# Patient Record
Sex: Male | Born: 2000 | Hispanic: Yes | Marital: Single | Smoking: Never smoker
Health system: Southern US, Community
[De-identification: ages and names within clinical notes are randomized; demographics above are authoritative.]

## PROBLEM LIST (undated history)

## (undated) HISTORY — PX: OTHER SURGICAL HISTORY: SHX169

---
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
## 2021-03-28 ENCOUNTER — Ambulatory Visit (INDEPENDENT_AMBULATORY_CARE_PROVIDER_SITE_OTHER): Payer: BC Managed Care – PPO | Admitting: Family Medicine

## 2021-03-28 ENCOUNTER — Encounter: Payer: Self-pay | Admitting: Family Medicine

## 2021-03-28 ENCOUNTER — Other Ambulatory Visit: Payer: Self-pay

## 2021-03-28 VITALS — BP 123/57 | HR 73 | Temp 99.3°F | Ht 64.0 in | Wt 147.0 lb

## 2021-03-28 DIAGNOSIS — J029 Acute pharyngitis, unspecified: Secondary | ICD-10-CM | POA: Diagnosis not present

## 2021-03-28 DIAGNOSIS — R509 Fever, unspecified: Secondary | ICD-10-CM | POA: Diagnosis not present

## 2021-03-28 LAB — POCT RAPID STREP A (OFFICE): Rapid Strep A Screen: NEGATIVE

## 2021-03-28 MED ORDER — PREDNISONE 20 MG PO TABS: 40.0000 mg | ORAL_TABLET | Freq: Every day | ORAL | 0 refills | Status: DC

## 2021-03-28 NOTE — Progress Notes (Signed)
Acute Office Visit  Subjective:    Patient ID: Ray Fernandez, male    DOB: 03-Oct-2000, 20 y.o.   MRN: [id number]  Chief Complaint  Patient presents with   Sore Throat    HPI Patient is in today for sore throat that started 2 days ago he has had some sinus congestion with it and a headache mostly frontal.  He has had some chills.  Been mostly using throat lozenges.  Today it is incredibly painful to eat and talk and swallow.  He has never had strep throat before he was around his cousin last week for the holidays who just had a cold.  He said his brother actually had strep throat about a month ago.  No other GI symptoms.  No ear pain or pressure.  No past medical history on file.  No past surgical history on file.  Family History  Problem Relation Age of Onset   Thyroid disease Mother    Hypertension Father    Eczema Sister    Eczema Brother    Eczema Brother    Stroke Paternal Grandfather     Social History   Socioeconomic History   Marital status: Single    Spouse name: Not on file   Number of children: Not on file   Years of education: Not on file   Highest education level: Not on file  Occupational History   Occupation: Biochemist, clinical  Tobacco Use   Smoking status: Never   Smokeless tobacco: Never  Vaping Use   Vaping Use: Every day   Start date: 09/29/2019   Substances: Nicotine, CBD  Substance and Sexual Activity   Alcohol use: Yes    Alcohol/week: 1.0 - 2.0 standard drink    Types: 1 - 2 Standard drinks or equivalent per week   Drug use: Not Currently   Sexual activity: Not Currently  Other Topics Concern   Not on file  Social History Narrative   Not on file   Social Determinants of Health   Financial Resource Strain: Not on file  Food Insecurity: Not on file  Transportation Needs: Not on file  Physical Activity: Not on file  Stress: Not on file  Social Connections: Not on file  Intimate Partner Violence: Not on file    Outpatient  Medications Prior to Visit  Medication Sig Dispense Refill   acetaminophen (TYLENOL) 325 MG tablet Take 2 tablets (650 mg total) by mouth every 6 (six) hours as needed.     Cetirizine HCl (ZYRTEC PO) Take by mouth.     doxycycline (VIBRA-TABS) 100 MG tablet Take 1 tablet (100 mg total) by mouth 2 (two) times daily. 20 tablet 0   No facility-administered medications prior to visit.    Allergies  Allergen Reactions   Ibuprofen Swelling    Angioedema of the Lips    Review of Systems     Objective:    Physical Exam Constitutional:      Appearance: He is well-developed.  HENT:     Head: Normocephalic and atraumatic.     Right Ear: External ear normal.     Left Ear: External ear normal.     Nose: Nose normal. No rhinorrhea.     Mouth/Throat:     Mouth: Mucous membranes are moist.     Pharynx: Posterior oropharyngeal erythema present. No oropharyngeal exudate.  Eyes:     Conjunctiva/sclera: Conjunctivae normal.     Pupils: Pupils are equal, round, and reactive to light.  Neck:  Thyroid: No thyromegaly.  Cardiovascular:     Rate and Rhythm: Normal rate.     Heart sounds: Normal heart sounds.  Pulmonary:     Effort: Pulmonary effort is normal.     Breath sounds: Normal breath sounds.  Musculoskeletal:     Cervical back: Neck supple.  Lymphadenopathy:     Cervical: No cervical adenopathy.  Skin:    General: Skin is warm and dry.  Neurological:     Mental Status: He is alert and oriented to person, place, and time.    BP (!) 123/57   Pulse 73   Temp 99.3 F (37.4 C)   Ht _0  (1.626 m)   Wt 147 lb (66.7 kg)   SpO2 95%   BMI 25.23 kg/m  Wt Readings from Last 3 Encounters:  03/28/21 147 lb (66.7 kg) (39 %, Z= -0.29)*  11/09/20 138 lb 11.2 oz (62.9 kg) (27 %, Z= -0.62)*  09/28/20 146 lb 4.8 oz (66.4 kg) (40 %, Z= -0.24)*   * Growth percentiles are based on CDC (Boys, 2-20 Years) data.    Health Maintenance Due  Topic Date Due   COVID-19 Vaccine (1) Never  done   HPV VACCINES (1 - Risk male 3-dose series) Never done   HIV Screening  Never done   Hepatitis C Screening  Never done   TETANUS/TDAP  Never done   INFLUENZA VACCINE  11/28/2020       Topic Date Due   HPV VACCINES (1 - Risk male 3-dose series) Never done     No results found for: TSH No results found for: WBC, HGB, HCT, MCV, PLT No results found for: NA, K, CHLORIDE, CO2, GLUCOSE, BUN, CREATININE, BILITOT, ALKPHOS, AST, ALT, PROT, ALBUMIN, CALCIUM, ANIONGAP, EGFR, GFR No results found for: CHOL No results found for: HDL No results found for: LDLCALC No results found for: TRIG No results found for: CHOLHDL No results found for: HGBA1C     Assessment & Plan:   Problem List Items Addressed This Visit   None Visit Diagnoses     Sore throat    -  Primary   Relevant Orders   POCT rapid strep A (Completed)   Culture, Group A Strep   Novel Coronavirus, NAA ([facility])   Fever, unspecified fever cause       Relevant Orders   POCT rapid strep A (Completed)   Culture, Group A Strep   Novel Coronavirus, NAA ([facility])      Pharyngitis-rapid strep was negative it was sent for culture.  We will also test for COVID just to make sure since he is supposed to be in a wrestling tournament this weekend to just 1 to make sure we are having a lot of patients complain of very sore throat with COVID since he also has some headache nasal congestion and fever.  Did not test for flu.  Did send over some oral prednisone since his throat seems swollen he actually has a most a muffled voice.  The do not see any significant tonsillar swelling.  Urged him to pick the prednisone up as soon as he leaves.  Meds ordered this encounter  Medications   predniSONE (DELTASONE) 20 MG tablet    Sig: Take 2 tablets (40 mg total) by mouth daily with breakfast.    Dispense:  10 tablet    Refill:  0      Beatrice Lecher, MD

## 2021-03-29 LAB — NOVEL CORONAVIRUS, NAA: SARS-CoV-2, NAA: NOT DETECTED

## 2021-03-29 LAB — SARS-COV-2, NAA 2 DAY TAT

## 2021-03-30 ENCOUNTER — Encounter: Payer: Self-pay | Admitting: Family Medicine

## 2021-03-30 LAB — CULTURE, GROUP A STREP
MICRO NUMBER:: 12692310
SPECIMEN QUALITY:: ADEQUATE

## 2021-03-30 NOTE — Progress Notes (Signed)
Not aware of any interaction between those 2 medications.  If he still having some increased shortness of breath let me know and I can send over an antibiotic.

## 2021-03-30 NOTE — Progress Notes (Signed)
Call patient: Negative for strep throat on the culture.  We are happy to provide a medical note to see if he might be would get his money back for the contest on Sunday.

## 2021-03-30 NOTE — Progress Notes (Signed)
Call patient: Negative for COVID.  Please see if his throat is feeling a little bit better on the steroids.

## 2021-04-07 DIAGNOSIS — M25521 Pain in right elbow: Secondary | ICD-10-CM | POA: Diagnosis not present

## 2021-04-07 DIAGNOSIS — S53104A Unspecified dislocation of right ulnohumeral joint, initial encounter: Secondary | ICD-10-CM | POA: Diagnosis not present

## 2021-05-13 IMAGING — DX DG HAND COMPLETE 3+V*L*
3 series · 3 of 3 positions shown · non-contrast
Comparison: None.

CLINICAL DATA: Left thumb and wrist pain and swelling beginning
last night after wrestling.

EXAM:
LEFT HAND - COMPLETE 3+ VIEW

[hand pa]
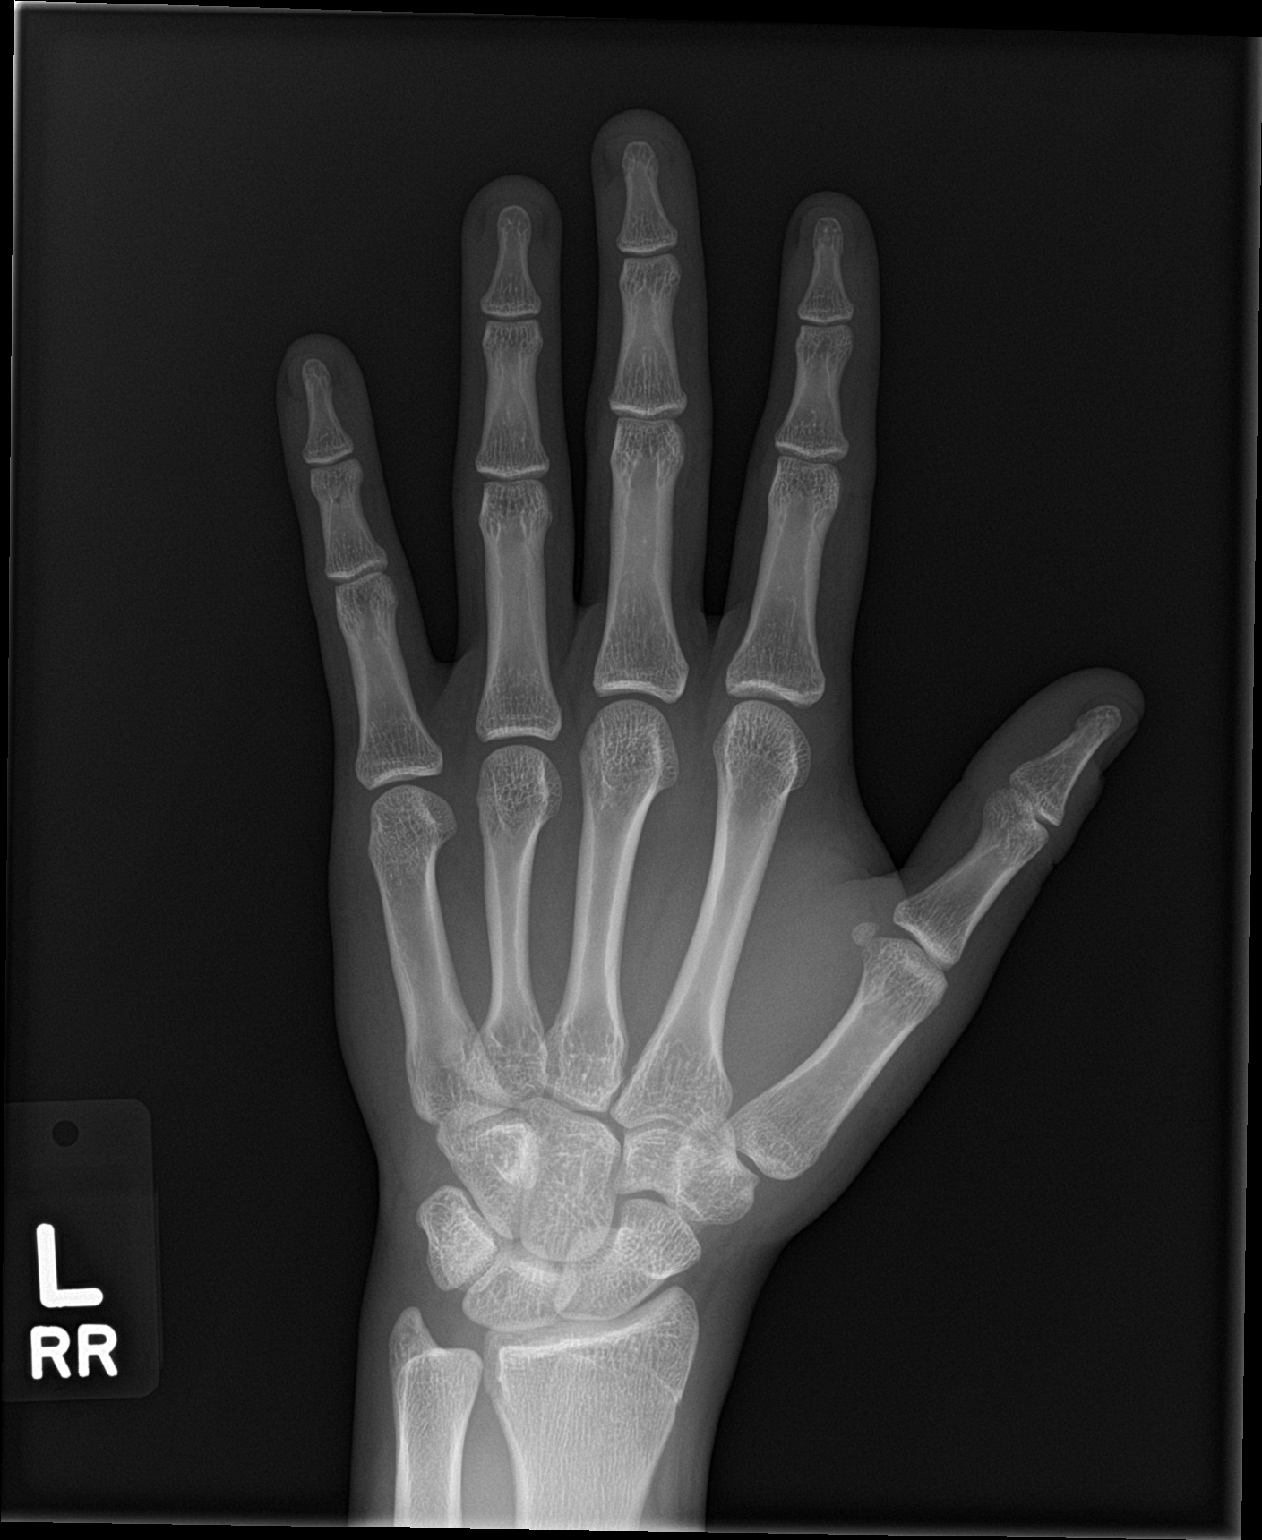

[hand obl]
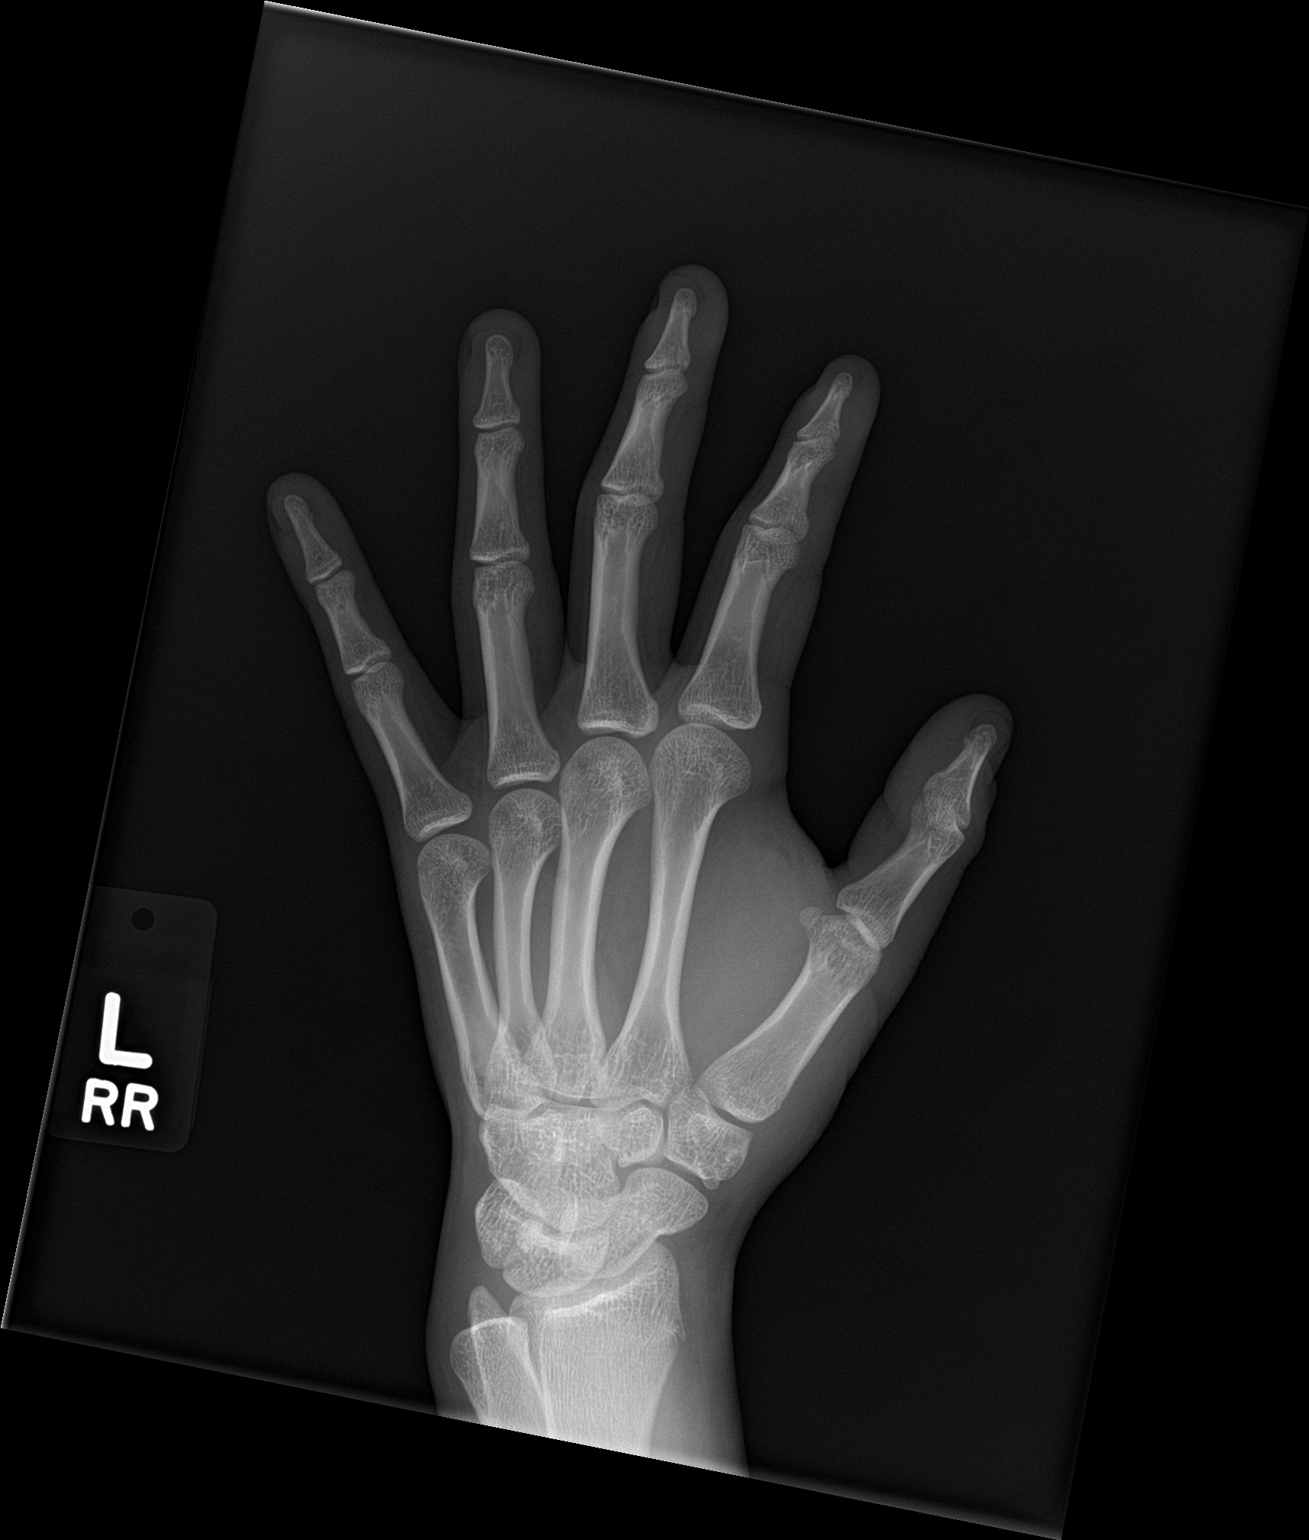

[hand lat]
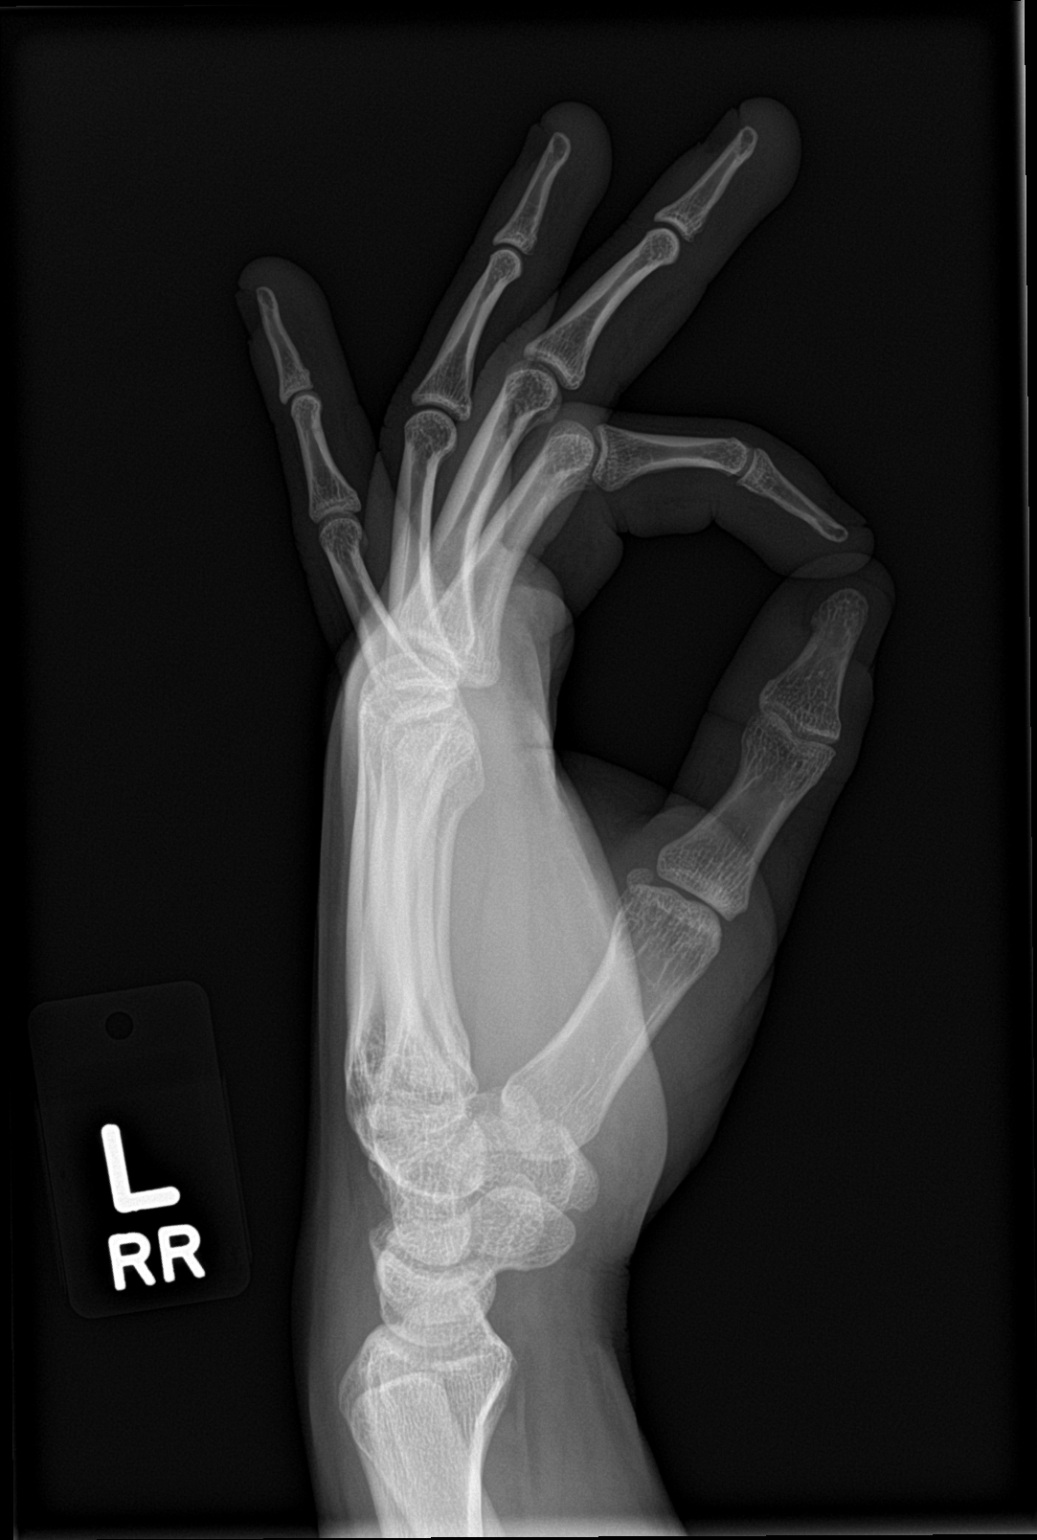

[3 of 3 positions shown; findings below may reference images not displayed]

FINDINGS: There is no evidence of fracture or dislocation. There is no
evidence of arthropathy or other focal bone abnormality. Soft
tissues are unremarkable.
IMPRESSION: Negative.

## 2021-05-13 IMAGING — DX DG WRIST COMPLETE 3+V*L*
4 series · 4 of 4 positions shown · non-contrast
Comparison: None.

CLINICAL DATA: Left thumb and wrist pain and swelling beginning
last night after wrestling.

EXAM:
LEFT WRIST - COMPLETE 3+ VIEW

[wrist pa]
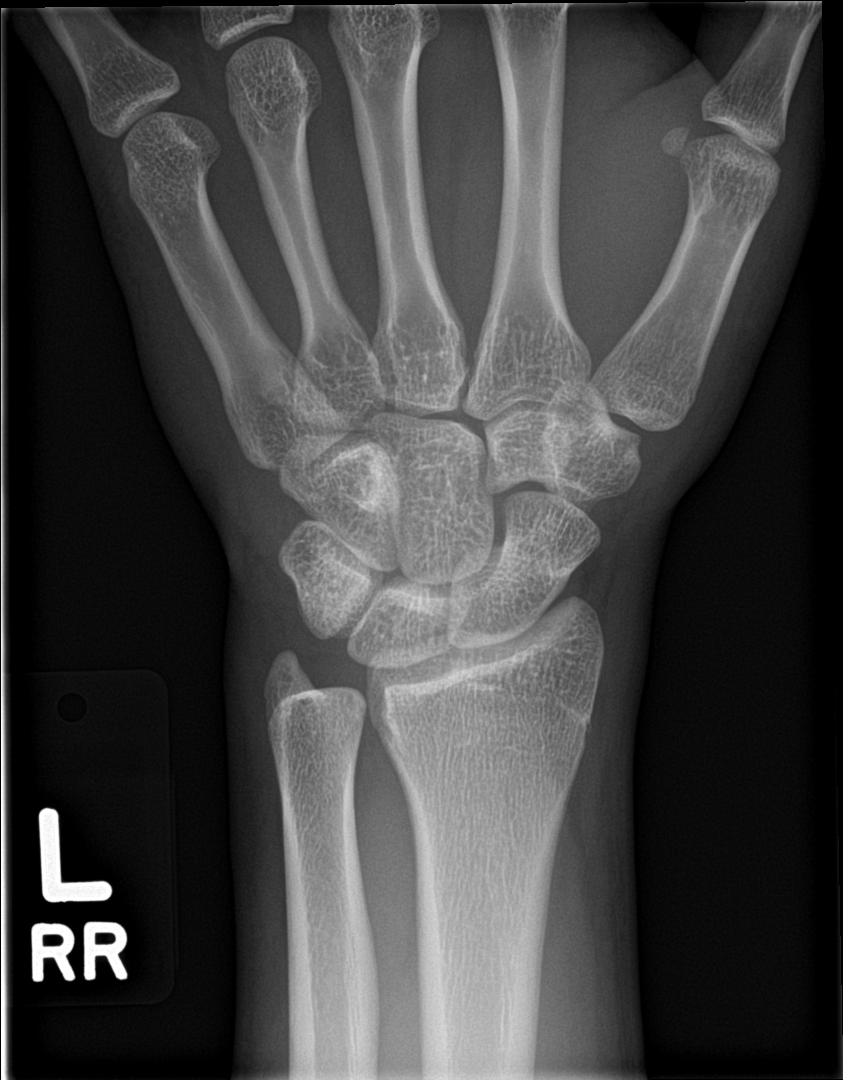

[wrist obl]
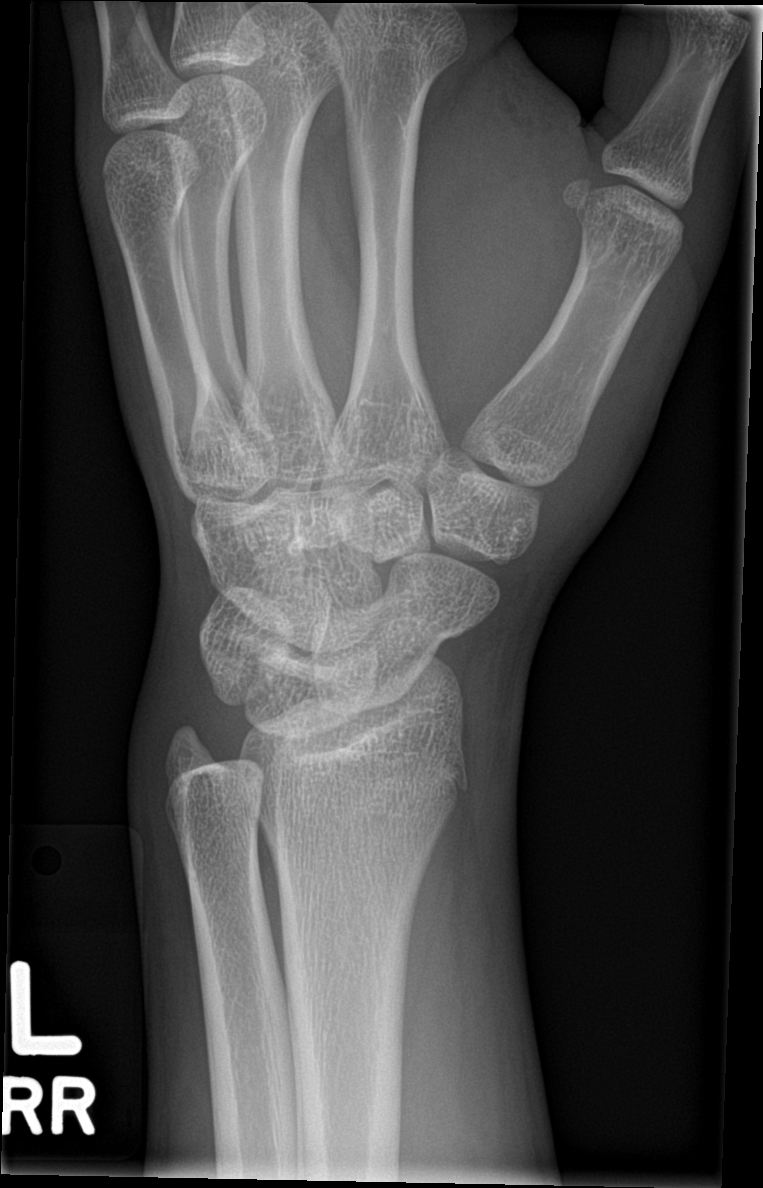

[wrist lat]
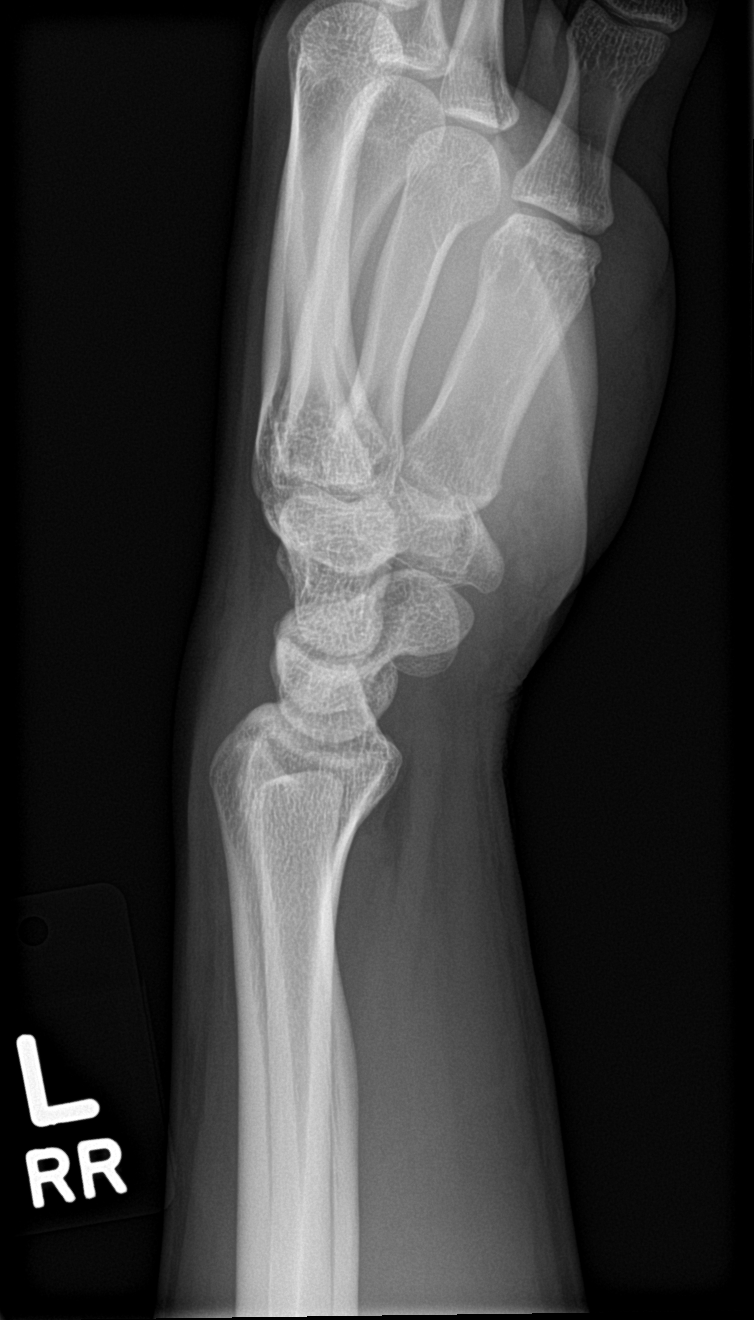

[wrist navicular]
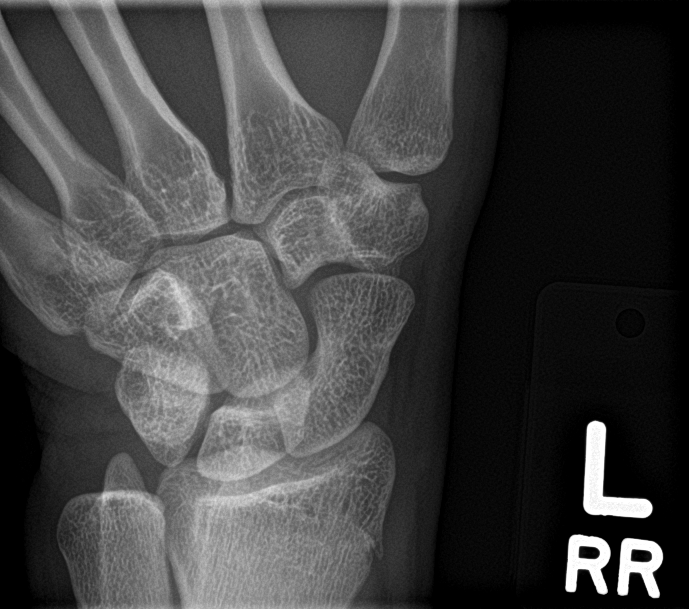

[4 of 4 positions shown; findings below may reference images not displayed]

FINDINGS: There is no evidence of fracture or dislocation. There is no
evidence of arthropathy or other focal bone abnormality. Soft
tissues are unremarkable.
IMPRESSION: Negative.

## 2021-05-14 DIAGNOSIS — S53431A Radial collateral ligament sprain of right elbow, initial encounter: Secondary | ICD-10-CM | POA: Diagnosis not present

## 2021-05-14 DIAGNOSIS — S53441A Ulnar collateral ligament sprain of right elbow, initial encounter: Secondary | ICD-10-CM | POA: Diagnosis not present

## 2021-05-14 DIAGNOSIS — M25421 Effusion, right elbow: Secondary | ICD-10-CM | POA: Diagnosis not present

## 2021-05-14 DIAGNOSIS — M25521 Pain in right elbow: Secondary | ICD-10-CM | POA: Diagnosis not present

## 2021-05-14 DIAGNOSIS — S53104A Unspecified dislocation of right ulnohumeral joint, initial encounter: Secondary | ICD-10-CM | POA: Diagnosis not present

## 2021-05-14 DIAGNOSIS — M7989 Other specified soft tissue disorders: Secondary | ICD-10-CM | POA: Diagnosis not present

## 2021-05-25 DIAGNOSIS — X58XXXD Exposure to other specified factors, subsequent encounter: Secondary | ICD-10-CM | POA: Diagnosis not present

## 2021-05-25 DIAGNOSIS — S53124D Posterior dislocation of right ulnohumeral joint, subsequent encounter: Secondary | ICD-10-CM | POA: Diagnosis not present

## 2021-05-25 DIAGNOSIS — Z789 Other specified health status: Secondary | ICD-10-CM | POA: Diagnosis not present

## 2021-05-25 DIAGNOSIS — R531 Weakness: Secondary | ICD-10-CM | POA: Diagnosis not present

## 2021-05-25 DIAGNOSIS — M25621 Stiffness of right elbow, not elsewhere classified: Secondary | ICD-10-CM | POA: Diagnosis not present

## 2021-05-25 DIAGNOSIS — M25521 Pain in right elbow: Secondary | ICD-10-CM | POA: Diagnosis not present

## 2021-05-25 DIAGNOSIS — M25529 Pain in unspecified elbow: Secondary | ICD-10-CM | POA: Diagnosis not present

## 2021-05-31 DIAGNOSIS — G5621 Lesion of ulnar nerve, right upper limb: Secondary | ICD-10-CM | POA: Diagnosis not present

## 2021-05-31 DIAGNOSIS — S53104D Unspecified dislocation of right ulnohumeral joint, subsequent encounter: Secondary | ICD-10-CM | POA: Diagnosis not present

## 2021-05-31 DIAGNOSIS — M25521 Pain in right elbow: Secondary | ICD-10-CM | POA: Diagnosis not present

## 2021-05-31 DIAGNOSIS — S46911A Strain of unspecified muscle, fascia and tendon at shoulder and upper arm level, right arm, initial encounter: Secondary | ICD-10-CM | POA: Diagnosis not present

## 2021-06-01 DIAGNOSIS — Z789 Other specified health status: Secondary | ICD-10-CM | POA: Diagnosis not present

## 2021-06-01 DIAGNOSIS — M25621 Stiffness of right elbow, not elsewhere classified: Secondary | ICD-10-CM | POA: Diagnosis not present

## 2021-06-01 DIAGNOSIS — S53124D Posterior dislocation of right ulnohumeral joint, subsequent encounter: Secondary | ICD-10-CM | POA: Diagnosis not present

## 2021-06-01 DIAGNOSIS — R531 Weakness: Secondary | ICD-10-CM | POA: Diagnosis not present

## 2021-06-01 DIAGNOSIS — M25529 Pain in unspecified elbow: Secondary | ICD-10-CM | POA: Diagnosis not present

## 2021-06-01 DIAGNOSIS — X58XXXD Exposure to other specified factors, subsequent encounter: Secondary | ICD-10-CM | POA: Diagnosis not present

## 2021-06-01 DIAGNOSIS — M25521 Pain in right elbow: Secondary | ICD-10-CM | POA: Diagnosis not present

## 2021-06-08 DIAGNOSIS — S53124D Posterior dislocation of right ulnohumeral joint, subsequent encounter: Secondary | ICD-10-CM | POA: Diagnosis not present

## 2021-06-08 DIAGNOSIS — M25621 Stiffness of right elbow, not elsewhere classified: Secondary | ICD-10-CM | POA: Diagnosis not present

## 2021-06-08 DIAGNOSIS — X58XXXD Exposure to other specified factors, subsequent encounter: Secondary | ICD-10-CM | POA: Diagnosis not present

## 2021-06-08 DIAGNOSIS — Z789 Other specified health status: Secondary | ICD-10-CM | POA: Diagnosis not present

## 2021-06-08 DIAGNOSIS — M25529 Pain in unspecified elbow: Secondary | ICD-10-CM | POA: Diagnosis not present

## 2021-06-08 DIAGNOSIS — M25521 Pain in right elbow: Secondary | ICD-10-CM | POA: Diagnosis not present

## 2021-06-08 DIAGNOSIS — R531 Weakness: Secondary | ICD-10-CM | POA: Diagnosis not present

## 2021-06-15 DIAGNOSIS — S53124D Posterior dislocation of right ulnohumeral joint, subsequent encounter: Secondary | ICD-10-CM | POA: Diagnosis not present

## 2021-06-15 DIAGNOSIS — R531 Weakness: Secondary | ICD-10-CM | POA: Diagnosis not present

## 2021-06-15 DIAGNOSIS — M25529 Pain in unspecified elbow: Secondary | ICD-10-CM | POA: Diagnosis not present

## 2021-06-15 DIAGNOSIS — Z789 Other specified health status: Secondary | ICD-10-CM | POA: Diagnosis not present

## 2021-06-15 DIAGNOSIS — M25621 Stiffness of right elbow, not elsewhere classified: Secondary | ICD-10-CM | POA: Diagnosis not present

## 2021-06-15 DIAGNOSIS — X58XXXD Exposure to other specified factors, subsequent encounter: Secondary | ICD-10-CM | POA: Diagnosis not present

## 2021-06-15 DIAGNOSIS — M25521 Pain in right elbow: Secondary | ICD-10-CM | POA: Diagnosis not present

## 2021-06-22 DIAGNOSIS — M25521 Pain in right elbow: Secondary | ICD-10-CM | POA: Diagnosis not present

## 2021-06-22 DIAGNOSIS — R531 Weakness: Secondary | ICD-10-CM | POA: Diagnosis not present

## 2021-06-22 DIAGNOSIS — M25621 Stiffness of right elbow, not elsewhere classified: Secondary | ICD-10-CM | POA: Diagnosis not present

## 2021-06-22 DIAGNOSIS — M25529 Pain in unspecified elbow: Secondary | ICD-10-CM | POA: Diagnosis not present

## 2021-06-22 DIAGNOSIS — X58XXXD Exposure to other specified factors, subsequent encounter: Secondary | ICD-10-CM | POA: Diagnosis not present

## 2021-06-22 DIAGNOSIS — S53124D Posterior dislocation of right ulnohumeral joint, subsequent encounter: Secondary | ICD-10-CM | POA: Diagnosis not present

## 2021-06-22 DIAGNOSIS — Z789 Other specified health status: Secondary | ICD-10-CM | POA: Diagnosis not present

## 2021-06-29 DIAGNOSIS — S53124D Posterior dislocation of right ulnohumeral joint, subsequent encounter: Secondary | ICD-10-CM | POA: Diagnosis not present

## 2021-06-29 DIAGNOSIS — X58XXXD Exposure to other specified factors, subsequent encounter: Secondary | ICD-10-CM | POA: Diagnosis not present

## 2021-06-29 DIAGNOSIS — R531 Weakness: Secondary | ICD-10-CM | POA: Diagnosis not present

## 2021-06-29 DIAGNOSIS — M25621 Stiffness of right elbow, not elsewhere classified: Secondary | ICD-10-CM | POA: Diagnosis not present

## 2021-06-29 DIAGNOSIS — M25529 Pain in unspecified elbow: Secondary | ICD-10-CM | POA: Diagnosis not present

## 2021-06-29 DIAGNOSIS — M25521 Pain in right elbow: Secondary | ICD-10-CM | POA: Diagnosis not present

## 2021-07-12 DIAGNOSIS — S53104D Unspecified dislocation of right ulnohumeral joint, subsequent encounter: Secondary | ICD-10-CM | POA: Diagnosis not present

## 2021-07-12 DIAGNOSIS — M25521 Pain in right elbow: Secondary | ICD-10-CM | POA: Diagnosis not present

## 2021-08-15 DIAGNOSIS — M222X2 Patellofemoral disorders, left knee: Secondary | ICD-10-CM | POA: Diagnosis not present

## 2021-08-15 DIAGNOSIS — G5621 Lesion of ulnar nerve, right upper limb: Secondary | ICD-10-CM | POA: Diagnosis not present

## 2021-08-15 DIAGNOSIS — M222X1 Patellofemoral disorders, right knee: Secondary | ICD-10-CM | POA: Diagnosis not present

## 2021-08-15 DIAGNOSIS — M9903 Segmental and somatic dysfunction of lumbar region: Secondary | ICD-10-CM | POA: Diagnosis not present

## 2021-08-24 DIAGNOSIS — M222X1 Patellofemoral disorders, right knee: Secondary | ICD-10-CM | POA: Diagnosis not present

## 2021-08-24 DIAGNOSIS — G5621 Lesion of ulnar nerve, right upper limb: Secondary | ICD-10-CM | POA: Diagnosis not present

## 2021-08-24 DIAGNOSIS — M9903 Segmental and somatic dysfunction of lumbar region: Secondary | ICD-10-CM | POA: Diagnosis not present

## 2021-08-24 DIAGNOSIS — M222X2 Patellofemoral disorders, left knee: Secondary | ICD-10-CM | POA: Diagnosis not present

## 2021-08-29 DIAGNOSIS — M9905 Segmental and somatic dysfunction of pelvic region: Secondary | ICD-10-CM | POA: Diagnosis not present

## 2021-08-29 DIAGNOSIS — G5621 Lesion of ulnar nerve, right upper limb: Secondary | ICD-10-CM | POA: Diagnosis not present

## 2021-08-29 DIAGNOSIS — M222X1 Patellofemoral disorders, right knee: Secondary | ICD-10-CM | POA: Diagnosis not present

## 2021-08-29 DIAGNOSIS — M7918 Myalgia, other site: Secondary | ICD-10-CM | POA: Diagnosis not present

## 2021-08-29 DIAGNOSIS — M545 Low back pain, unspecified: Secondary | ICD-10-CM | POA: Diagnosis not present

## 2021-08-29 DIAGNOSIS — M9903 Segmental and somatic dysfunction of lumbar region: Secondary | ICD-10-CM | POA: Diagnosis not present

## 2021-08-29 DIAGNOSIS — M222X2 Patellofemoral disorders, left knee: Secondary | ICD-10-CM | POA: Diagnosis not present

## 2021-08-29 DIAGNOSIS — M9904 Segmental and somatic dysfunction of sacral region: Secondary | ICD-10-CM | POA: Diagnosis not present

## 2021-08-29 DIAGNOSIS — M25561 Pain in right knee: Secondary | ICD-10-CM | POA: Diagnosis not present

## 2021-08-30 ENCOUNTER — Ambulatory Visit (INDEPENDENT_AMBULATORY_CARE_PROVIDER_SITE_OTHER): Payer: BC Managed Care – PPO | Admitting: Physician Assistant

## 2021-08-30 ENCOUNTER — Encounter: Payer: Self-pay | Admitting: Physician Assistant

## 2021-08-30 VITALS — BP 113/64 | HR 54 | Temp 98.4°F | Ht 64.0 in | Wt 147.0 lb

## 2021-08-30 DIAGNOSIS — N451 Epididymitis: Secondary | ICD-10-CM | POA: Diagnosis not present

## 2021-08-30 MED ORDER — DOXYCYCLINE HYCLATE 100 MG PO TABS: 100.0000 mg | ORAL_TABLET | Freq: Two times a day (BID) | ORAL | 0 refills | Status: DC

## 2021-08-30 MED ORDER — CEFTRIAXONE SODIUM 500 MG IJ SOLR
500.0000 mg | Freq: Once | INTRAMUSCULAR | Status: AC
  Administered 2021-08-30: 500 mg via INTRAMUSCULAR

## 2021-08-30 NOTE — Progress Notes (Signed)
? ?  Acute Office Visit ? ?Subjective:  ? ?  ?Patient ID: Ray Fernandez, male    DOB: 2001/02/25, 21 y.o.   MRN: [id number] ? ?Chief Complaint  ?Patient presents with  ? Testicle Pain  ?  History of epididymis - was given antibiotics, best friend died and he didn't finish them, still having problems  ? ? ?HPI ?Patient is in today to discuss left testicular pain. He has hx of ependymitis that was confirmed by ultrasound on 07/08/2020 and sent levaquin and then followed up with Dr. Ashley Royalty on 09/28/2020 and given doxycycline that he did not finish. It would get mostly better and then he would stop treatment. Mostly effects left but can start to effect right testicle. Denies any painful intercourse, blood in semen/urine, dysuria or penile discharge.  He tested negative for STIs in the past. He would like to be treated again.  ? ?ROS ?See HPI.  ?   ?Objective:  ?  ?BP 113/64   Pulse (!) 54   Temp 98.4 ?F (36.9 ?C) (Oral)   Ht 5\' 4"  (1.626 m)   Wt 147 lb (66.7 kg)   SpO2 100%   BMI 25.23 kg/m?  ? ? ? ? ?   ?Assessment & Plan:  ?..Dauntae was seen today for testicle pain. ? ?Diagnoses and all orders for this visit: ? ?Epididymitis ?-     doxycycline (VIBRA-TABS) 100 MG tablet; Take 1 tablet (100 mg total) by mouth 2 (two) times daily. ?-     cefTRIAXone (ROCEPHIN) injection 500 mg ? ? ?If not improving in next 7 days need to follow up with PCP to discuss more intervention ?Treated with rocephin and doxycycline for 10 days ?Ibuprofen as needed for pain and swelling ? ? ?Enid Derry, PA-C ? ? ?

## 2021-08-30 NOTE — Patient Instructions (Addendum)
Doxycycline for 10 days.  ?Epididymitis ? ?Epididymitis is inflammation or swelling of the epididymis. This is caused by an infection. The epididymis is a cord-like structure that is located along the top and back part of the testicle. It collects and stores sperm from the testicle. ?This condition can also cause pain and swelling of the testicle and scrotum. Symptoms usually start suddenly (acute epididymitis). Sometimes epididymitis starts gradually and lasts for a while (chronic epididymitis). Chronic epididymitis may be harder to treat. ?What are the causes? ?In men ages 27-40, this condition is usually caused by a bacterial infection or a sexually transmitted infection (STI), such as gonorrhea or chlamydia. ?In men 57 and older, this condition is usually caused by bacteria from a urinary blockage or from abnormalities in the urinary system. These can result from: ?Having a tube placed into the bladder (urinary catheter). ?Having an enlarged or inflamed prostate gland. ?Having recently had urinary tract surgery. ?Having a problem with a backward flow of urine (retrograde). ?In men who have a condition that weakens the body's defense system (immune system), such as human immunodeficiency virus (HIV), this condition can be caused by: ?Other bacteria, including tuberculosis and syphilis. ?Viruses. ?Fungi. ?Sometimes this condition occurs without infection. This may happen because of trauma or repetitive activities such as sports. ?What increases the risk? ?You are more likely to develop this condition if you have: ?Unprotected sex with more than one partner. ?Anal sex. ?Had recent surgery. ?A urinary catheter. ?Urinary problems. ?A suppressed immune system. ?What are the signs or symptoms? ?This condition usually begins suddenly with chills, fever, and pain behind the scrotum and in the testicle. Other symptoms include: ?Swelling of the scrotum, testicle, or both. ?Pain when ejaculating or urinating. ?Pain in the  back or abdomen. ?Nausea. ?Itching and discharge from the penis. ?A frequent need to pass urine. ?Redness, increased warmth, and tenderness of the scrotum. ?How is this diagnosed? ?Your health care provider can diagnose this condition based on your symptoms and medical history. Your health care provider will also do a physical exam to check your scrotum and testicle for swelling, pain, and redness. You may also have other tests, including: ?Testing of discharge from the penis. ?Testing your urine for infections, such as STIs. ?Ultrasound to check for blood flow and inflammation. ?Your health care provider may test you for other STIs, including HIV. ?How is this treated? ?Treatment for this condition depends on the cause. If your condition is caused by a bacterial infection, oral antibiotic medicine may be prescribed. If the bacterial infection has spread to your blood, you may need to receive IV antibiotics. ?For both bacterial and nonbacterial epididymitis, you may be treated with: ?Rest. ?Elevation of the scrotum. ?Pain medicines. ?Anti-inflammatory medicines. ?Surgery may be needed if: ?You have pus buildup in the scrotum (abscess). ?You have epididymitis that has not responded to other treatments. ?Follow these instructions at home: ?Medicines ?Take over-the-counter and prescription medicines only as told by your health care provider. ?If you were prescribed an antibiotic medicine, take it as told by your health care provider. Do not stop taking the antibiotic even if your condition improves. ?Sexual activity ?If your epididymitis was caused by an STI, avoid sexual activity until your treatment is complete. ?Inform your sexual partner or partners if you test positive for an STI. They may need to be treated. Do not engage in sexual activity with your partner or partners until their treatment is completed. ?Managing pain and swelling ? ?If directed, raise (elevate)  your scrotum and apply ice. To do this: ?Put ice  in a plastic bag. ?Place a small towel or pillow between your legs. ?Rest your scrotum on the pillow or towel. ?Place another towel between your skin and the plastic bag. ?Leave the ice on for 20 minutes, 2-3 times a day. ?Remove the ice if your skin turns bright red. This is very important. If you cannot feel pain, heat, or cold, you have a greater risk of damage to the area. ?Keep your scrotum elevated and supported while resting. Ask your health care provider if you should wear a scrotal support, such as a jockstrap. Wear it as told by your health care provider. ?Try taking a sitz bath to help with discomfort. This is a warm water bath that is taken while you are sitting down. The water should come up to your hips and should cover your buttocks. Do this 3-4 times per day or as told by your health care provider. ?General instructions ?Drink enough fluid to keep your urine pale yellow. ?Return to your normal activities as told by your health care provider. Ask your health care provider what activities are safe for you. ?Keep all follow-up visits. This is important. ?Contact a health care provider if: ?You have a fever. ?Your pain medicine is not helping. ?Your pain is getting worse. ?Your symptoms do not improve within 3 days. ?Summary ?Epididymitis is inflammation or swelling of the epididymis. This is caused by an infection. This condition can also cause pain and swelling of the testicle and scrotum. ?Treatment for this condition depends on the cause. If your condition is caused by a bacterial infection, oral antibiotic medicine may be prescribed. ?Inform your sexual partner or partners if you test positive for an STI. They may need to be treated. Do not engage in sexual activity with your partner or partners until their treatment is completed. ?Contact a health care provider if your symptoms do not improve within 3 days. ?This information is not intended to replace advice given to you by your health care  provider. Make sure you discuss any questions you have with your health care provider. ?Document Revised: 11/23/2020 Document Reviewed: 11/23/2020 ?Elsevier Patient Education ? 2023 [employer] ? ?

## 2021-09-07 ENCOUNTER — Encounter: Payer: Self-pay | Admitting: Family Medicine

## 2021-09-07 ENCOUNTER — Ambulatory Visit (INDEPENDENT_AMBULATORY_CARE_PROVIDER_SITE_OTHER): Payer: BC Managed Care – PPO | Admitting: Family Medicine

## 2021-09-07 ENCOUNTER — Telehealth: Payer: Self-pay | Admitting: Family Medicine

## 2021-09-07 VITALS — BP 112/58 | HR 62 | Ht 64.0 in | Wt 148.0 lb

## 2021-09-07 DIAGNOSIS — R9431 Abnormal electrocardiogram [ECG] [EKG]: Secondary | ICD-10-CM | POA: Diagnosis not present

## 2021-09-07 DIAGNOSIS — R55 Syncope and collapse: Secondary | ICD-10-CM | POA: Diagnosis not present

## 2021-09-07 NOTE — Telephone Encounter (Signed)
Added Aunt with Epilepsy to family history per family request.  ?

## 2021-09-07 NOTE — Progress Notes (Signed)
? ?Acute Office Visit ? ?Subjective:  ? ?  ?Patient ID: Ray Fernandez, male    DOB: 04-08-2001, 21 y.o.   MRN: [id number] ? ?Chief Complaint  ?Patient presents with  ? Weakness  ? Loss of Consciousness  ? ? ?HPI ?Patient is in today for syncope. ? ?He was actually seen in our office about 7 days ago for left testicular pain and was treated for epididymitis.  He had had previous issues with this the prior year in 2022.  Time he was treated with oral doxycycline and Rocephin 500 mg injection.  He says since being on the antibiotics he has had loose stools.  And then yesterday he said he worked out extra hard doing some cardio.  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  He said he really did not have much to eat or drink yesterday he hydrated a little but probably not enough by the time dinnertime came around he was sitting there and said he felt like his ears were starting to rollover and his vision was starting to narrow.  He said he felt like he just wanted to sleep.  He said that sensation lasted maybe 5 minutes and then he actually passed out maybe 4-minute.  He came back to and then passed out maybe 10 or 15 minutes later again briefly for maybe a minute.  EMS was called and they did check him out he did not go to the emergency department.  He does not remember experiencing any chest pain or shortness of breath or palpitations just the feeling in his head like he was going to pass out.  He had had edible marijuana yesterday as well. ? ?He has passed out once before and that was on a camping trip when he had had mushrooms. ? ?Today he does feel better he says he still feels a little lightheaded but he also feels hungry but he has not eaten today yet. ? ?ROS ? ? ?   ?Objective:  ?  ?BP (!) 112/58   Pulse 62   Ht 5\' 4"  (1.626 m)   Wt 148 lb (67.1 kg)   SpO2 100%   BMI 25.40 kg/m?   ? ? ?Physical Exam ?Constitutional:   ?   Appearance: He is well-developed.  ?HENT:  ?   Head: Normocephalic and atraumatic.  ?   Right Ear: External ear normal.  ?   Left Ear: External ear normal.  ?   Nose: Nose normal.  ?Eyes:  ?   Conjunctiva/sclera: Conjunctivae normal.  ?   Pupils: Pupils are equal, round, and reactive to light.  ?Neck:  ?   Thyroid: No thyromegaly.  ?Cardiovascular:  ?   Rate and Rhythm: Normal rate.  ?   Heart sounds: Normal heart sounds.  ?Pulmonary:  ?   Effort: Pulmonary effort is normal.  ?   Breath sounds: Normal breath sounds.  ?Musculoskeletal:  ?   Cervical back: Neck supple.  ?Lymphadenopathy:  ?   Cervical: No cervical adenopathy.  ?Skin: ?   General: Skin is warm and dry.  ?Neurological:  ?   Mental Status: He is alert and oriented to person, place, and time.  ? ? ?No results found for any visits on 09/07/21. ? ? ?   ?Assessment & Plan:  ? ?Problem List Items Addressed This Visit   ?None ?Visit  Diagnoses   ? ? Syncope, unspecified syncope type    -  Primary  ? Relevant Orders  ? CBC with Differential/Platelet  ? TSH  ? COMPLETE METABOLIC PANEL WITH GFR  ? EKG 12-Lead  ? Ambulatory referral to Cardiology  ? Abnormal EKG      ? Relevant Orders  ? Ambulatory referral to Cardiology  ? ?  ? ? ?Syncope yesterday evening-think probably secondary to loose stools from his antibiotics, dehydration and from increased excess exercise and having edible marijuana married.  Was probably the perfect storm.  Though we will do some labs today just to rule out electrolyte abnormality, thyroid disorder, anemia etc.  KG today shows rate of 53 bpm, sinus bradycardia with incomplete right bundle branch block.  No prior EKGs on file for comparison.  No way to really know if this is his baseline or not.  Will refer to cardiology nonurgently for further evaluation for the abnormal EKG.  But again I think the syncope was probably from a combination of factors but mostly dehydration.  Monitor for any new or  worsening symptoms.  In the meantime make sure hydrating well especially with exercise and eating regularly.  He will actually finishes antibiotics in about 2 days so that should hopefully help with the loose stools.  Encouraged him to consider taking probiotic or eating yogurt. ? ?No orders of the defined types were placed in this encounter. ? ? ?No follow-ups on file. ? ?Beatrice Lecher, MD ? ? ?

## 2021-09-07 NOTE — Telephone Encounter (Signed)
Patient's mother was in office and stated that she wanted to make sure with the family history with patient, to make sure Epilepsy is added to the record as well. It is patient's mother's half sister? Please Advise. AMUCK ?

## 2021-09-08 ENCOUNTER — Other Ambulatory Visit: Payer: Self-pay | Admitting: *Deleted

## 2021-09-08 ENCOUNTER — Telehealth: Payer: Self-pay

## 2021-09-08 DIAGNOSIS — R7401 Elevation of levels of liver transaminase levels: Secondary | ICD-10-CM

## 2021-09-08 LAB — CBC WITH DIFFERENTIAL/PLATELET
Absolute Monocytes: 502 cells/uL (ref 200–950)
Basophils Absolute: 41 cells/uL (ref 0–200)
Basophils Relative: 0.7 %
Eosinophils Absolute: 83 cells/uL (ref 15–500)
Eosinophils Relative: 1.4 %
HCT: 43.6 % (ref 38.5–50.0)
Hemoglobin: 14.6 g/dL (ref 13.2–17.1)
Lymphs Abs: 1888 cells/uL (ref 850–3900)
MCH: 29.4 pg (ref 27.0–33.0)
MCHC: 33.5 g/dL (ref 32.0–36.0)
MCV: 87.7 fL (ref 80.0–100.0)
MPV: 11.8 fL (ref 7.5–12.5)
Monocytes Relative: 8.5 %
Neutro Abs: 3387 cells/uL (ref 1500–7800)
Neutrophils Relative %: 57.4 %
Platelets: 244 10*3/uL (ref 140–400)
RBC: 4.97 10*6/uL (ref 4.20–5.80)
RDW: 12.6 % (ref 11.0–15.0)
Total Lymphocyte: 32 %
WBC: 5.9 10*3/uL (ref 3.8–10.8)

## 2021-09-08 LAB — COMPLETE METABOLIC PANEL WITH GFR
AG Ratio: 1.9 (calc) (ref 1.0–2.5)
ALT: 20 U/L (ref 8–46)
AST: 40 U/L — ABNORMAL HIGH (ref 12–32)
Albumin: 4.4 g/dL (ref 3.6–5.1)
Alkaline phosphatase (APISO): 76 U/L (ref 46–169)
BUN: 15 mg/dL (ref 7–20)
CO2: 27 mmol/L (ref 20–32)
Calcium: 9.7 mg/dL (ref 8.9–10.4)
Chloride: 104 mmol/L (ref 98–110)
Creat: 0.88 mg/dL (ref 0.60–1.24)
Globulin: 2.3 g/dL (calc) (ref 2.1–3.5)
Glucose, Bld: 95 mg/dL (ref 65–99)
Potassium: 4.4 mmol/L (ref 3.8–5.1)
Sodium: 139 mmol/L (ref 135–146)
Total Bilirubin: 1 mg/dL (ref 0.2–1.1)
Total Protein: 6.7 g/dL (ref 6.3–8.2)
eGFR: 127 mL/min/{1.73_m2} (ref >=60)

## 2021-09-08 LAB — TSH: TSH: 0.58 mIU/L (ref 0.50–4.30)

## 2021-09-08 NOTE — Telephone Encounter (Signed)
Mom is requested a referral to Neurology due to family history of Maternal Grandmother and Maternal Aunt having epilepsy. ?

## 2021-09-08 NOTE — Progress Notes (Signed)
Hi Evangelos, one of your liver enzymes is a little elevated the AST.  I would like to recheck that in about 2 weeks to make sure that it is going back down to normal.  Please avoid any alcohol or Tylenol products during that time.  Your blood count is normal no sign of anemia.  Thyroid looks great.

## 2021-09-09 NOTE — Telephone Encounter (Signed)
Since he did have an abnormal EKG I am referring him to Cards. Lets check that out first and then we can circle back to Neurology ?

## 2021-09-12 DIAGNOSIS — G5621 Lesion of ulnar nerve, right upper limb: Secondary | ICD-10-CM | POA: Diagnosis not present

## 2021-09-12 DIAGNOSIS — M545 Low back pain, unspecified: Secondary | ICD-10-CM | POA: Diagnosis not present

## 2021-09-12 DIAGNOSIS — M9904 Segmental and somatic dysfunction of sacral region: Secondary | ICD-10-CM | POA: Diagnosis not present

## 2021-09-12 DIAGNOSIS — M9905 Segmental and somatic dysfunction of pelvic region: Secondary | ICD-10-CM | POA: Diagnosis not present

## 2021-09-12 DIAGNOSIS — M9903 Segmental and somatic dysfunction of lumbar region: Secondary | ICD-10-CM | POA: Diagnosis not present

## 2021-09-12 DIAGNOSIS — M7918 Myalgia, other site: Secondary | ICD-10-CM | POA: Diagnosis not present

## 2021-09-12 DIAGNOSIS — M222X1 Patellofemoral disorders, right knee: Secondary | ICD-10-CM | POA: Diagnosis not present

## 2021-09-12 DIAGNOSIS — M222X2 Patellofemoral disorders, left knee: Secondary | ICD-10-CM | POA: Diagnosis not present

## 2021-09-12 NOTE — Telephone Encounter (Signed)
Called pt and advised him of Dr. Shelah Lewandowsky recommendations. Advised that should he wish to move fwd with the neurology referral he should schedule an appointment w/pcp to discuss and get this done. He voiced understanding and agreed.   ?

## 2021-09-21 NOTE — Progress Notes (Signed)
Referring-Catherine Metheney MD Reason for referral-syncope  HPI: 21 year old male for evaluation of syncope at request of Beatrice Lecher MD.  Laboratories May 2023 showed hemoglobin 14.6 and potassium 4.4.  Patient typically does not have dyspnea on exertion, orthopnea, PND, pedal edema, palpitations.  He states approximately 2 weeks ago he was having dinner.  He had a "edible" 30 minutes prior to eating.  He developed a queasy sensation at dinner followed by frank syncope.  He was unconscious for approximately 15 seconds and his family laid him on the ground.  There is no preceding palpitations, dyspnea or chest pain.  He denies incontinence.  Question of transient seizure activity.  When he awoke they sat him up.  He stood to leave 10 minutes later and had a second syncopal episode.  He was unconscious for 30 seconds with similar description.  Cardiology is now asked to evaluate.  He is only previous syncopal episode was at a camp after "mushrooms".  No family history of sudden death.  Current Outpatient Medications  Medication Sig Dispense Refill   doxycycline (VIBRA-TABS) 100 MG tablet Take 1 tablet (100 mg total) by mouth 2 (two) times daily. (Patient not taking: Reported on 09/27/2021) 20 tablet 0   No current facility-administered medications for this visit.    Allergies  Allergen Reactions   Ibuprofen Swelling    Angioedema of the Lips    History reviewed. No pertinent past medical history.  Past Surgical History:  Procedure Laterality Date   No prior surgery      Social History   Socioeconomic History   Marital status: Single    Spouse name: Not on file   Number of children: Not on file   Years of education: Not on file   Highest education level: Not on file  Occupational History   Occupation: Biochemist, clinical  Tobacco Use   Smoking status: Never   Smokeless tobacco: Never  Vaping Use   Vaping Use: Every day   Start date: 09/29/2019   Substances: Nicotine,  CBD  Substance and Sexual Activity   Alcohol use: Yes    Alcohol/week: 1.0 - 2.0 standard drink    Types: 1 - 2 Standard drinks or equivalent per week    Comment: Occasional   Drug use: Yes    Types: Marijuana   Sexual activity: Not Currently  Other Topics Concern   Not on file  Social History Narrative   Not on file   Social Determinants of Health   Financial Resource Strain: Not on file  Food Insecurity: Not on file  Transportation Needs: Not on file  Physical Activity: Not on file  Stress: Not on file  Social Connections: Not on file  Intimate Partner Violence: Not on file    Family History  Problem Relation Age of Onset   Thyroid disease Mother    Hypertension Father    Eczema Sister    Eczema Brother    Eczema Brother    Epilepsy Maternal Aunt    Epilepsy Maternal Grandmother    Stroke Paternal Grandfather     ROS: no fevers or chills, productive cough, hemoptysis, dysphasia, odynophagia, melena, hematochezia, dysuria, hematuria, rash, seizure activity, orthopnea, PND, pedal edema, claudication. Remaining systems are negative.  Physical Exam:   Blood pressure 112/76, pulse 75, height 5\' 4"  (1.626 m), weight 146 lb (66.2 kg), SpO2 98 %.  General:  Well developed/well nourished in NAD Skin warm/dry Patient not depressed No peripheral clubbing Back-normal HEENT-normal/normal eyelids Neck supple/normal carotid  upstroke bilaterally; no bruits; no JVD; no thyromegaly chest - CTA/ normal expansion CV - RRR/normal S1 and S2; no murmurs, rubs or gallops;  PMI nondisplaced Abdomen -NT/ND, no HSM, no mass, + bowel sounds, no bruit 2+ femoral pulses, no bruits Ext-no edema, chords, 2+ DP Neuro-grossly nonfocal  ECG -Sep 07, 2021-sinus bradycardia, incomplete right bundle branch block; pattern suggestive of type II Brugada.  Personally reviewed  A/P  1 syncope-etiology unclear.  May be a contribution from dehydration as patient stated he had exercise vigorously  prior to the episode.  There may also been a vagal contribution given description of mild nausea preceding the event.  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  I will arrange an echocardiogram to assess LV function.  I will also place a monitor to rule out ventricular arrhythmias.  I will ask one of our electrophysiologist to review as well.  Patient has been instructed not to drive for 6 months following his recent event.  Kirk Ruths, MD

## 2021-09-22 DIAGNOSIS — R7401 Elevation of levels of liver transaminase levels: Secondary | ICD-10-CM | POA: Diagnosis not present

## 2021-09-23 LAB — AST: AST: 17 U/L (ref 12–32)

## 2021-09-26 NOTE — Progress Notes (Signed)
HI Ray Fernandez, repeat AST is back to normal, Yeah!!

## 2021-09-27 ENCOUNTER — Ambulatory Visit (INDEPENDENT_AMBULATORY_CARE_PROVIDER_SITE_OTHER): Payer: BC Managed Care – PPO | Admitting: Cardiology

## 2021-09-27 ENCOUNTER — Encounter: Payer: Self-pay | Admitting: Cardiology

## 2021-09-27 VITALS — BP 112/76 | HR 75 | Ht 64.0 in | Wt 146.0 lb

## 2021-09-27 DIAGNOSIS — M9903 Segmental and somatic dysfunction of lumbar region: Secondary | ICD-10-CM | POA: Diagnosis not present

## 2021-09-27 DIAGNOSIS — R55 Syncope and collapse: Secondary | ICD-10-CM

## 2021-09-27 DIAGNOSIS — M222X2 Patellofemoral disorders, left knee: Secondary | ICD-10-CM | POA: Diagnosis not present

## 2021-09-27 DIAGNOSIS — M222X1 Patellofemoral disorders, right knee: Secondary | ICD-10-CM | POA: Diagnosis not present

## 2021-09-27 DIAGNOSIS — G5621 Lesion of ulnar nerve, right upper limb: Secondary | ICD-10-CM | POA: Diagnosis not present

## 2021-09-27 NOTE — Patient Instructions (Signed)
  Testing/Procedures:  Your physician has requested that you have an echocardiogram. Echocardiography is a painless test that uses sound waves to create images of your heart. It provides your doctor with information about the size and shape of your heart and how well your heart's chambers and valves are working. This procedure takes approximately one hour. There are no restrictions for this procedure. [address] FLO[address] IMAGING DEPARTMENT  Your physician has recommended that you wear an event monitor. Event monitors are medical devices that record the heart's electrical activity. Doctors most often [place] these monitors to diagnose arrhythmias. Arrhythmias are problems with the speed or rhythm of the heartbeat. The monitor is a small, portable device. You can wear one while you do your normal daily activities. This is usually used to diagnose what is causing palpitations/syncope (passing out).    Follow-Up: At [facility], you and your health needs are our priority.  As part of our continuing mission to provide you with exceptional heart care, we have created designated Provider Care Teams.  These Care Teams include your primary Cardiologist (physician) and Advanced Practice Providers (APPs -  Physician Assistants and Nurse Practitioners) who all work together to provide you with the care you need, when you need it.  We recommend signing up for the patient portal called "MyChart".  Sign up information is provided on this After Visit Summary.  MyChart is used to connect with patients for Virtual Visits (Telemedicine).  Patients are able to view lab/test results, encounter notes, upcoming appointments, etc.  Non-urgent messages can be sent to your provider as well.   To learn more about what you can do with MyChart, go to [email or web address].    Your next appointment:   4 month(s)  The format for your next appointment:   In Person  Provider:   Olga Millers, MD      Important Information About Sugar

## 2021-10-02 DIAGNOSIS — M25521 Pain in right elbow: Secondary | ICD-10-CM | POA: Diagnosis not present

## 2021-10-02 DIAGNOSIS — G5621 Lesion of ulnar nerve, right upper limb: Secondary | ICD-10-CM | POA: Diagnosis not present

## 2021-10-02 DIAGNOSIS — S53104D Unspecified dislocation of right ulnohumeral joint, subsequent encounter: Secondary | ICD-10-CM | POA: Diagnosis not present

## 2021-10-10 ENCOUNTER — Ambulatory Visit (HOSPITAL_BASED_OUTPATIENT_CLINIC_OR_DEPARTMENT_OTHER): Payer: BC Managed Care – PPO

## 2021-10-11 ENCOUNTER — Ambulatory Visit (INDEPENDENT_AMBULATORY_CARE_PROVIDER_SITE_OTHER): Payer: BC Managed Care – PPO

## 2021-10-11 DIAGNOSIS — R55 Syncope and collapse: Secondary | ICD-10-CM

## 2021-10-12 DIAGNOSIS — R55 Syncope and collapse: Secondary | ICD-10-CM | POA: Diagnosis not present

## 2021-10-16 ENCOUNTER — Institutional Professional Consult (permissible substitution): Payer: BC Managed Care – PPO | Admitting: Cardiology

## 2021-10-17 ENCOUNTER — Ambulatory Visit (HOSPITAL_BASED_OUTPATIENT_CLINIC_OR_DEPARTMENT_OTHER)
Admission: RE | Admit: 2021-10-17 | Discharge: 2021-10-17 | Disposition: A | Discharge status: Home / Self Care | Payer: BC Managed Care – PPO | Source: Ambulatory Visit | Attending: Cardiology | Admitting: Cardiology

## 2021-10-17 DIAGNOSIS — R55 Syncope and collapse: Secondary | ICD-10-CM | POA: Diagnosis not present

## 2021-10-17 LAB — ECHOCARDIOGRAM COMPLETE
AR max vel: 2.65 cm2
AV Area VTI: 2.59 cm2
AV Area mean vel: 2.17 cm2
AV Mean grad: 3 mmHg
AV Peak grad: 5.3 mmHg
Ao pk vel: 1.15 m/s
Area-P 1/2: 3.97 cm2
S' Lateral: 3.5 cm

## 2021-10-17 NOTE — Progress Notes (Signed)
  Echocardiogram 2D Echocardiogram has been performed.  Ray Fernandez F 10/17/2021, 4:09 PM

## 2021-10-30 ENCOUNTER — Telehealth: Payer: Self-pay | Admitting: Cardiology

## 2021-10-30 NOTE — Telephone Encounter (Signed)
[employer] calling with a critical EKG. 

## 2021-10-30 NOTE — Telephone Encounter (Signed)
   Cardiac Monitor Alert  Date of alert:  10/30/2021   Patient Name: Ray Fernandez  DOB: [birth year]  MRN: [id number]   Monitor Information: Cardiac Event Monitor [[employer]]  Reason:  SVT 185 bpm Ordering provider:  Dr. Jens Som  Alert Supraventricular Tachycardia - fastest HR:  185 bpm This is the 1st alert for this rhythm.   Next Cardiology Appointment Date:  7/17  Provider:  Dr. Elberta Fortis  Critical alert received from [employer]. The patient had a 25 second run of SVT with heart rate 185. The patient has a new patient appointment with Dr. Elberta Fortis on 7/17 for possible Brugada Syndrome.  Spoke with the patient. He stated that he was currently at work and was not aware of any SVT or fast heart rate. He was asymptomatic, denying chest pain, shortness of breath, syncope and palpitations. Event monitor strip has been placed in Dr. Ludwig Clarks box for review.

## 2021-11-13 ENCOUNTER — Institutional Professional Consult (permissible substitution): Payer: BC Managed Care – PPO | Admitting: Cardiology

## 2021-11-15 ENCOUNTER — Ambulatory Visit (INDEPENDENT_AMBULATORY_CARE_PROVIDER_SITE_OTHER): Payer: BC Managed Care – PPO | Admitting: Family Medicine

## 2021-11-15 ENCOUNTER — Encounter: Payer: Self-pay | Admitting: Family Medicine

## 2021-11-15 VITALS — BP 116/73 | HR 100 | Temp 98.2°F | Ht 64.0 in | Wt 144.1 lb

## 2021-11-15 DIAGNOSIS — Z1159 Encounter for screening for other viral diseases: Secondary | ICD-10-CM

## 2021-11-15 DIAGNOSIS — Z114 Encounter for screening for human immunodeficiency virus [HIV]: Secondary | ICD-10-CM | POA: Diagnosis not present

## 2021-11-15 DIAGNOSIS — Z0000 Encounter for general adult medical examination without abnormal findings: Secondary | ICD-10-CM

## 2021-11-15 DIAGNOSIS — Z113 Encounter for screening for infections with a predominantly sexual mode of transmission: Secondary | ICD-10-CM

## 2021-11-15 NOTE — Patient Instructions (Signed)
Preventive Care 18-21 Years Old, Male ?Preventive care refers to lifestyle choices and visits with your health care provider that can promote health and wellness. At this stage in your life, you may start seeing a primary care physician instead of a pediatrician for your preventive care. Preventive care visits are also called wellness exams. ?What can I expect for my preventive care visit? ?Counseling ?During your preventive care visit, your health care provider may ask about your: ?Medical history, including: ?Past medical problems. ?Family medical history. ?Current health, including: ?Home life and relationship well-being. ?Emotional well-being. ?Sexual activity and sexual health. ?Lifestyle, including: ?Alcohol, nicotine or tobacco, and drug use. ?Access to firearms. ?Diet, exercise, and sleep habits. ?Sunscreen use. ?Motor vehicle safety. ?Physical exam ?Your health care provider may check your: ?Height and weight. These may be used to calculate your BMI (body mass index). BMI is a measurement that tells if you are at a healthy weight. ?Waist circumference. This measures the distance around your waistline. This measurement also tells if you are at a healthy weight and may help predict your risk of certain diseases, such as type 2 diabetes and high blood pressure. ?Heart rate and blood pressure. ?Body temperature. ?Skin for abnormal spots. ?What immunizations do I need? ? ?Vaccines are usually given at various ages, according to a schedule. Your health care provider will recommend vaccines for you based on your age, medical history, and lifestyle or other factors, such as travel or where you work. ?What tests do I need? ?Screening ?Your health care provider may recommend screening tests for certain conditions. This may include: ?Vision and hearing tests. ?Lipid and cholesterol levels. ?Hepatitis B test. ?Hepatitis C test. ?HIV (human immunodeficiency virus) test. ?STI (sexually transmitted infection) testing, if  you are at risk. ?Tuberculosis skin test. ?Talk with your health care provider about your test results, treatment options, and if necessary, the need for more tests. ?Follow these instructions at home: ?Eating and drinking ? ?Eat a healthy diet that includes fresh fruits and vegetables, whole grains, lean protein, and low-fat dairy products. ?Drink enough fluid to keep your urine pale yellow. ?Do not drink alcohol if: ?Your health care provider tells you not to drink. ?You are under the legal drinking age. In the [place] the legal drinking age is 21. ?If you drink alcohol: ?Limit how much you have to 0-2 drinks a day. ?Know how much alcohol is in your drink. In the [place] one drink equals one 12 oz bottle of beer (355 mL), one 5 oz glass of wine (148 mL), or one 1? oz glass of hard liquor (44 mL). ?Lifestyle ?Brush your teeth every morning and night with fluoride toothpaste. Floss one time each day. ?Exercise for at least 30 minutes 5 or more days of the week. ?Do not use any products that contain nicotine or tobacco. These products include cigarettes, chewing tobacco, and vaping devices, such as e-cigarettes. If you need help quitting, ask your health care provider. ?Do not use drugs. ?If you are sexually active, practice safe sex. Use a condom or other form of protection to prevent STIs. ?Find healthy ways to manage stress, such as: ?Meditation, yoga, or listening to music. ?Journaling. ?Talking to a trusted person. ?Spending time with friends and family. ?Safety ?Always wear your seat belt while driving or riding in a vehicle. ?Do not drive: ?If you have been drinking alcohol. Do not ride with someone who has been drinking. ?When you are tired or distracted. ?While texting. ?If you have been using   any mind-altering substances or drugs. ?Wear a helmet and other protective equipment during sports activities. ?If you have firearms in your house, make sure you follow all gun safety procedures. ?Seek help if you have  been bullied, physically abused, or sexually abused. ?Use the internet responsibly to avoid dangers, such as online bullying and online sex predators. ?What's next? ?Go to your health care provider once a year for an annual wellness visit. ?Ask your health care provider how often you should have your eyes and teeth checked. ?Stay up to date on all vaccines. ?This information is not intended to replace advice given to you by your health care provider. Make sure you discuss any questions you have with your health care provider. ?Document Revised: 10/12/2020 Document Reviewed: 10/12/2020 ?Elsevier Patient Education ? 2023 [employer] Inc[employer] ? ?

## 2021-11-15 NOTE — Progress Notes (Signed)
Ray Fernandez - 21 y.o. male MRN [id number]  Date of birth: [birth year]  Subjective Chief Complaint  Patient presents with   Annual Exam    HPI Ray Fernandez is a 21 y.o. male here today for annual exam.   Doing well overall. No new concerns. Had syncopal episode previously from dehydration. No further issues since then.    He stays pretty active.  Feels like diet is pretty good.   Immunizations are UTD.   Liver enzymes mildly elevated on previous labs. Consume EtOH occasionally.  Uses marijuana intermittently.    Review of Systems  Constitutional:  Negative for chills, fever, malaise/fatigue and weight loss.  HENT:  Negative for congestion, ear pain and sore throat.   Eyes:  Negative for blurred vision, double vision and pain.  Respiratory:  Negative for cough and shortness of breath.   Cardiovascular:  Negative for chest pain and palpitations.  Gastrointestinal:  Negative for abdominal pain, blood in stool, constipation, heartburn and nausea.  Genitourinary:  Negative for dysuria and urgency.  Musculoskeletal:  Negative for joint pain and myalgias.  Neurological:  Negative for dizziness and headaches.  Endo/Heme/Allergies:  Does not bruise/bleed easily.  Psychiatric/Behavioral:  Negative for depression. The patient is not nervous/anxious and does not have insomnia.     Allergies  Allergen Reactions   Ibuprofen Swelling    Angioedema of the Lips    No past medical history on file.  Past Surgical History:  Procedure Laterality Date   No prior surgery      Social History   Socioeconomic History   Marital status: Single    Spouse name: Not on file   Number of children: Not on file   Years of education: Not on file   Highest education level: Not on file  Occupational History   Occupation: Company secretary  Tobacco Use   Smoking status: Never   Smokeless tobacco: Never  Vaping Use   Vaping Use: Every day   Start date: 09/29/2019   Substances:  Nicotine, CBD  Substance and Sexual Activity   Alcohol use: Yes    Alcohol/week: 1.0 - 2.0 standard drink of alcohol    Types: 1 - 2 Standard drinks or equivalent per week    Comment: Occasional   Drug use: Yes    Types: Marijuana   Sexual activity: Not Currently  Other Topics Concern   Not on file  Social History Narrative   Not on file   Social Determinants of Health   Financial Resource Strain: Not on file  Food Insecurity: Not on file  Transportation Needs: Not on file  Physical Activity: Not on file  Stress: Not on file  Social Connections: Not on file    Family History  Problem Relation Age of Onset   Thyroid disease Mother    Hypertension Father    Eczema Sister    Eczema Brother    Eczema Brother    Epilepsy Maternal Aunt    Epilepsy Maternal Grandmother    Stroke Paternal Grandfather     Health Maintenance  Topic Date Due   COVID-19 Vaccine (1) Never done   HPV VACCINES (1 - Male 2-dose series) Never done   Hepatitis C Screening  Never done   TETANUS/TDAP  Never done   INFLUENZA VACCINE  11/28/2021   HIV Screening  Completed     ----------------------------------------------------------------------------------------------------------------------------------------------------------------------------------------------------------------- Physical Exam BP 116/73 (BP Location: Left Arm, Patient Position: Sitting, Cuff Size: Normal)   Pulse 100   Temp 98.2 F (36.8  C) (Oral)   Ht 5\' 4"  (1.626 m)   Wt 144 lb 1.3 oz (65.4 kg)   SpO2 98%   BMI 24.73 kg/m   Physical Exam Constitutional:      General: He is not in acute distress. HENT:     Head: Normocephalic and atraumatic.     Right Ear: Tympanic membrane and external ear normal.     Left Ear: Tympanic membrane and external ear normal.  Eyes:     General: No scleral icterus. Neck:     Thyroid: No thyromegaly.  Cardiovascular:     Rate and Rhythm: Normal rate and regular rhythm.     Heart sounds:  Normal heart sounds.  Pulmonary:     Effort: Pulmonary effort is normal.     Breath sounds: Normal breath sounds.  Abdominal:     General: Bowel sounds are normal. There is no distension.     Palpations: Abdomen is soft.     Tenderness: There is no abdominal tenderness. There is no guarding.  Musculoskeletal:     Cervical back: Normal range of motion.  Lymphadenopathy:     Cervical: No cervical adenopathy.  Skin:    General: Skin is warm and dry.     Findings: No rash.  Neurological:     Mental Status: He is alert and oriented to person, place, and time.     Cranial Nerves: No cranial nerve deficit.     Motor: No abnormal muscle tone.  Psychiatric:        Mood and Affect: Mood normal.        Behavior: Behavior normal.     ------------------------------------------------------------------------------------------------------------------------------------------------------------------------------------------------------------------- Assessment and Plan  Well adult exam Well adult Orders Placed This Encounter  Procedures   Chlamydia/Neisseria Gonorrhoeae RNA,TMA,Urogenital   COMPLETE METABOLIC PANEL WITH GFR   CBC with Differential   HIV antibody (with reflex)   Hepatitis C Antibody  Screening: per lab orders Immunizations: UTD Anticipatory guidance/Risk factor reduction:  Recommendations per AVS   No orders of the defined types were placed in this encounter.   No follow-ups on file.    This visit occurred during the SARS-CoV-2 public health emergency.  Safety protocols were in place, including screening questions prior to the visit, additional usage of staff PPE, and extensive cleaning of exam room while observing appropriate contact time as indicated for disinfecting solutions.

## 2021-11-15 NOTE — Assessment & Plan Note (Signed)
Well adult Orders Placed This Encounter  Procedures  . Chlamydia/Neisseria Gonorrhoeae RNA,TMA,Urogenital  . COMPLETE METABOLIC PANEL WITH GFR  . CBC with Differential  . HIV antibody (with reflex)  . Hepatitis C Antibody  Screening: per lab orders Immunizations: UTD Anticipatory guidance/Risk factor reduction:  Recommendations per AVS

## 2021-11-20 LAB — CBC WITH DIFFERENTIAL/PLATELET
Absolute Monocytes: 374 cells/uL (ref 200–950)
Basophils Absolute: 17 cells/uL (ref 0–200)
Basophils Relative: 0.2 %
Eosinophils Absolute: 9 cells/uL — ABNORMAL LOW (ref 15–500)
Eosinophils Relative: 0.1 %
HCT: 44.6 % (ref 38.5–50.0)
Hemoglobin: 15.3 g/dL (ref 13.2–17.1)
Lymphs Abs: 1192 cells/uL (ref 850–3900)
MCH: 30 pg (ref 27.0–33.0)
MCHC: 34.3 g/dL (ref 32.0–36.0)
MCV: 87.5 fL (ref 80.0–100.0)
MPV: 11.8 fL (ref 7.5–12.5)
Monocytes Relative: 4.3 %
Neutro Abs: 7108 cells/uL (ref 1500–7800)
Neutrophils Relative %: 81.7 %
Platelets: 251 10*3/uL (ref 140–400)
RBC: 5.1 10*6/uL (ref 4.20–5.80)
RDW: 12.3 % (ref 11.0–15.0)
Total Lymphocyte: 13.7 %
WBC: 8.7 10*3/uL (ref 3.8–10.8)

## 2021-11-20 LAB — COMPLETE METABOLIC PANEL WITH GFR
AG Ratio: 1.8 (calc) (ref 1.0–2.5)
ALT: 20 U/L (ref 9–46)
AST: 24 U/L (ref 10–40)
Albumin: 4.8 g/dL (ref 3.6–5.1)
Alkaline phosphatase (APISO): 83 U/L (ref 36–130)
BUN: 21 mg/dL (ref 7–25)
CO2: 22 mmol/L (ref 20–32)
Calcium: 9.3 mg/dL (ref 8.6–10.3)
Chloride: 101 mmol/L (ref 98–110)
Creat: 1.24 mg/dL (ref 0.60–1.24)
Globulin: 2.6 g/dL (calc) (ref 1.9–3.7)
Glucose, Bld: 117 mg/dL — ABNORMAL HIGH (ref 65–99)
Potassium: 3.8 mmol/L (ref 3.5–5.3)
Sodium: 137 mmol/L (ref 135–146)
Total Bilirubin: 1.1 mg/dL (ref 0.2–1.2)
Total Protein: 7.4 g/dL (ref 6.1–8.1)
eGFR: 85 mL/min/{1.73_m2} (ref >=60)

## 2021-11-20 LAB — CHLAMYDIA/NEISSERIA GONORRHOEAE RNA,TMA,UROGENTIAL
C. trachomatis RNA, TMA: NOT DETECTED
N. gonorrhoeae RNA, TMA: NOT DETECTED

## 2021-11-20 LAB — HEPATITIS C ANTIBODY: Hepatitis C Ab: NONREACTIVE

## 2021-11-20 LAB — HIV ANTIBODY (ROUTINE TESTING W REFLEX): HIV 1&2 Ab, 4th Generation: NONREACTIVE

## 2021-12-08 DIAGNOSIS — G5621 Lesion of ulnar nerve, right upper limb: Secondary | ICD-10-CM | POA: Diagnosis not present

## 2021-12-08 DIAGNOSIS — M222X1 Patellofemoral disorders, right knee: Secondary | ICD-10-CM | POA: Diagnosis not present

## 2021-12-08 DIAGNOSIS — M222X2 Patellofemoral disorders, left knee: Secondary | ICD-10-CM | POA: Diagnosis not present

## 2021-12-08 DIAGNOSIS — M9903 Segmental and somatic dysfunction of lumbar region: Secondary | ICD-10-CM | POA: Diagnosis not present

## 2021-12-08 DIAGNOSIS — M545 Low back pain, unspecified: Secondary | ICD-10-CM | POA: Diagnosis not present

## 2021-12-08 DIAGNOSIS — M9904 Segmental and somatic dysfunction of sacral region: Secondary | ICD-10-CM | POA: Diagnosis not present

## 2021-12-08 DIAGNOSIS — M9905 Segmental and somatic dysfunction of pelvic region: Secondary | ICD-10-CM | POA: Diagnosis not present

## 2021-12-08 DIAGNOSIS — M25521 Pain in right elbow: Secondary | ICD-10-CM | POA: Diagnosis not present

## 2021-12-12 DIAGNOSIS — M545 Low back pain, unspecified: Secondary | ICD-10-CM | POA: Diagnosis not present

## 2021-12-12 DIAGNOSIS — M9905 Segmental and somatic dysfunction of pelvic region: Secondary | ICD-10-CM | POA: Diagnosis not present

## 2021-12-12 DIAGNOSIS — M9904 Segmental and somatic dysfunction of sacral region: Secondary | ICD-10-CM | POA: Diagnosis not present

## 2021-12-12 DIAGNOSIS — M222X1 Patellofemoral disorders, right knee: Secondary | ICD-10-CM | POA: Diagnosis not present

## 2021-12-12 DIAGNOSIS — M25521 Pain in right elbow: Secondary | ICD-10-CM | POA: Diagnosis not present

## 2021-12-12 DIAGNOSIS — G5621 Lesion of ulnar nerve, right upper limb: Secondary | ICD-10-CM | POA: Diagnosis not present

## 2021-12-12 DIAGNOSIS — M222X2 Patellofemoral disorders, left knee: Secondary | ICD-10-CM | POA: Diagnosis not present

## 2021-12-12 DIAGNOSIS — M9903 Segmental and somatic dysfunction of lumbar region: Secondary | ICD-10-CM | POA: Diagnosis not present

## 2021-12-18 ENCOUNTER — Institutional Professional Consult (permissible substitution): Payer: BC Managed Care – PPO | Admitting: Cardiology

## 2022-01-02 NOTE — Progress Notes (Deleted)
     HPI: FU syncope.  Laboratories May 2023 showed hemoglobin 14.6 and potassium 4.4.  Echocardiogram June 2023 showed normal LV function.  Monitor July 2023 showed short runs of SVT.  At previous office visit we felt that dehydration with possible vagal contribution had caused his syncope.  However his electrocardiogram was concerning for potential Brugada syndrome type II.  He was referred to electrophysiology but this did not occur.  Since last seen  Current Outpatient Medications  Medication Sig Dispense Refill   doxycycline (VIBRA-TABS) 100 MG tablet Take 1 tablet (100 mg total) by mouth 2 (two) times daily. (Patient not taking: Reported on 09/27/2021) 20 tablet 0   No current facility-administered medications for this visit.     No past medical history on file.  Past Surgical History:  Procedure Laterality Date   No prior surgery      Social History   Socioeconomic History   Marital status: Single    Spouse name: Not on file   Number of children: Not on file   Years of education: Not on file   Highest education level: Not on file  Occupational History   Occupation: Company secretary  Tobacco Use   Smoking status: Never   Smokeless tobacco: Never  Vaping Use   Vaping Use: Every day   Start date: 09/29/2019   Substances: Nicotine, CBD  Substance and Sexual Activity   Alcohol use: Yes    Alcohol/week: 1.0 - 2.0 standard drink of alcohol    Types: 1 - 2 Standard drinks or equivalent per week    Comment: Occasional   Drug use: Yes    Types: Marijuana   Sexual activity: Not Currently  Other Topics Concern   Not on file  Social History Narrative   Not on file   Social Determinants of Health   Financial Resource Strain: Not on file  Food Insecurity: Not on file  Transportation Needs: Not on file  Physical Activity: Not on file  Stress: Not on file  Social Connections: Not on file  Intimate Partner Violence: Not on file    Family History  Problem Relation Age  of Onset   Thyroid disease Mother    Hypertension Father    Eczema Sister    Eczema Brother    Eczema Brother    Epilepsy Maternal Aunt    Epilepsy Maternal Grandmother    Stroke Paternal Grandfather     ROS: no fevers or chills, productive cough, hemoptysis, dysphasia, odynophagia, melena, hematochezia, dysuria, hematuria, rash, seizure activity, orthopnea, PND, pedal edema, claudication. Remaining systems are negative.  Physical Exam: Well-developed well-nourished in no acute distress.  Skin is warm and dry.  HEENT is normal.  Neck is supple.  Chest is clear to auscultation with normal expansion.  Cardiovascular exam is regular rate and rhythm.  Abdominal exam nontender or distended. No masses palpated. Extremities show no edema. neuro grossly intact  ECG- personally reviewed  A/P  1 syncope-previous ECG with question Brugada syndrome type II.  Olga Millers, MD

## 2022-01-10 ENCOUNTER — Ambulatory Visit: Payer: BC Managed Care – PPO | Admitting: Cardiology

## 2022-07-30 DIAGNOSIS — R059 Cough, unspecified: Secondary | ICD-10-CM | POA: Diagnosis not present

## 2022-07-30 DIAGNOSIS — J302 Other seasonal allergic rhinitis: Secondary | ICD-10-CM | POA: Diagnosis not present

## 2022-07-30 DIAGNOSIS — R0982 Postnasal drip: Secondary | ICD-10-CM | POA: Diagnosis not present

## 2023-01-30 ENCOUNTER — Ambulatory Visit (INDEPENDENT_AMBULATORY_CARE_PROVIDER_SITE_OTHER): Payer: BC Managed Care – PPO | Admitting: Family Medicine

## 2023-01-30 VITALS — BP 111/65 | HR 75 | Ht 64.0 in | Wt 155.0 lb

## 2023-01-30 DIAGNOSIS — Z0000 Encounter for general adult medical examination without abnormal findings: Secondary | ICD-10-CM

## 2023-01-30 DIAGNOSIS — Z23 Encounter for immunization: Secondary | ICD-10-CM

## 2023-01-30 DIAGNOSIS — Z13220 Encounter for screening for lipoid disorders: Secondary | ICD-10-CM

## 2023-01-30 NOTE — Addendum Note (Signed)
Addended by: Ardyth Man on: 01/30/2023 12:04 PM   Modules accepted: Orders

## 2023-01-30 NOTE — Progress Notes (Signed)
Ray Fernandez - 22 y.o. male MRN [id number]  Date of birth: 12/20/2000  Subjective Chief Complaint  Patient presents with   Annual Exam    HPI Ray Fernandez is a 21 y.o. male here today for annual exam.   Reports that he is doing pretty well.  Has still had some occasional testicular pain.  Improved since previous tx for epididymitis. Past [place] without nodules or concerns for malignancy.   He is moderately active.  Feels that diet could be better.   He does smoke 2-3 cigs per day.  Occasional EtOH use.   Review of Systems  Constitutional:  Negative for chills, fever, malaise/fatigue and weight loss.  HENT:  Negative for congestion, ear pain and sore throat.   Eyes:  Negative for blurred vision, double vision and pain.  Respiratory:  Negative for cough and shortness of breath.   Cardiovascular:  Negative for chest pain and palpitations.  Gastrointestinal:  Negative for abdominal pain, blood in stool, constipation, heartburn and nausea.  Genitourinary:  Negative for dysuria and urgency.  Musculoskeletal:  Negative for joint pain and myalgias.  Neurological:  Negative for dizziness and headaches.  Endo/Heme/Allergies:  Does not bruise/bleed easily.  Psychiatric/Behavioral:  Negative for depression. The patient is not nervous/anxious and does not have insomnia.     Allergies  Allergen Reactions   Ibuprofen Swelling    Angioedema of the Lips    History reviewed. No pertinent past medical history.  Past Surgical History:  Procedure Laterality Date   No prior surgery      Social History   Socioeconomic History   Marital status: Single    Spouse name: Not on file   Number of children: Not on file   Years of education: Not on file   Highest education level: Associate degree: occupational, Scientist, product/process development, or vocational program  Occupational History   Occupation: Company secretary  Tobacco Use   Smoking status: Never   Smokeless tobacco: Never  Vaping Use   Vaping  status: Every Day   Start date: 09/29/2019   Substances: Nicotine, CBD  Substance and Sexual Activity   Alcohol use: Yes    Alcohol/week: 1.0 - 2.0 standard drink of alcohol    Types: 1 - 2 Standard drinks or equivalent per week    Comment: Occasional   Drug use: Yes    Types: Marijuana   Sexual activity: Not Currently  Other Topics Concern   Not on file  Social History Narrative   Not on file   Social Determinants of Health   Financial Resource Strain: Low Risk  (01/30/2023)   Overall Financial Resource Strain (CARDIA)    Difficulty of Paying Living Expenses: Not very hard  Food Insecurity: Food Insecurity Present (01/30/2023)   Hunger Vital Sign    Worried About Running Out of Food in the Last Year: Sometimes true    Ran Out of Food in the Last Year: Never true  Transportation Needs: No Transportation Needs (01/30/2023)   PRAPARE - Administrator, Civil Service (Medical): No    Lack of Transportation (Non-Medical): No  Physical Activity: Sufficiently Active (01/30/2023)   Exercise Vital Sign    Days of Exercise per Week: 5 days    Minutes of Exercise per Session: 90 min  Stress: Stress Concern Present (01/30/2023)   [employer] of Occupational Health - Occupational Stress Questionnaire    Feeling of Stress : Rather much  Social Connections: Unknown (01/30/2023)   Social Connection and Isolation Panel [NHANES]  Frequency of Communication with Friends and Family: Three times a week    Frequency of Social Gatherings with Friends and Family: Three times a week    Attends Religious Services: Never    Active Member of Clubs or Organizations: Yes    Attends Engineer, structural: More than 4 times per year    Marital Status: Patient declined    Family History  Problem Relation Age of Onset   Thyroid disease Mother    Hypertension Father    Eczema Sister    Eczema Brother    Eczema Brother    Epilepsy Maternal Aunt    Epilepsy Maternal Grandmother     Stroke Paternal Grandfather     Health Maintenance  Topic Date Due   DTaP/Tdap/Td (6 - Tdap) 11/03/2012   COVID-19 Vaccine (1 - 2023-24 season) 02/15/2023 (Originally 12/30/2022)   INFLUENZA VACCINE  07/29/2023 (Originally 11/29/2022)   HPV VACCINES  Completed   Hepatitis C Screening  Completed   HIV Screening  Completed     ----------------------------------------------------------------------------------------------------------------------------------------------------------------------------------------------------------------- Physical Exam BP 111/65 (BP Location: Left Arm, Patient Position: Sitting, Cuff Size: Normal)   Pulse 75   Ht 5\' 4"  (1.626 m)   Wt 155 lb (70.3 kg)   SpO2 99%   BMI 26.61 kg/m   Physical Exam Constitutional:      General: He is not in acute distress. HENT:     Head: Normocephalic and atraumatic.     Right Ear: Tympanic membrane and external ear normal.     Left Ear: Tympanic membrane and external ear normal.  Eyes:     General: No scleral icterus. Neck:     Thyroid: No thyromegaly.  Cardiovascular:     Rate and Rhythm: Normal rate and regular rhythm.     Heart sounds: Normal heart sounds.  Pulmonary:     Effort: Pulmonary effort is normal.     Breath sounds: Normal breath sounds.  Abdominal:     General: Bowel sounds are normal. There is no distension.     Palpations: Abdomen is soft.     Tenderness: There is no abdominal tenderness. There is no guarding.  Musculoskeletal:     Cervical back: Normal range of motion.  Lymphadenopathy:     Cervical: No cervical adenopathy.  Skin:    General: Skin is warm and dry.     Findings: No rash.  Neurological:     Mental Status: He is alert and oriented to person, place, and time.     Cranial Nerves: No cranial nerve deficit.     Motor: No abnormal muscle tone.  Psychiatric:        Mood and Affect: Mood normal.        Behavior: Behavior normal.      ------------------------------------------------------------------------------------------------------------------------------------------------------------------------------------------------------------------- Assessment and Plan  Well adult exam Well adult Orders Placed This Encounter  Procedures   CMP14+EGFR   CBC with Differential/Platelet   Lipid Panel With LDL/HDL Ratio   Testosterone  Screening: per lab orders Immunizations: UTD Anticipatory guidance/Risk factor reduction:  Recommendations per AVS   No orders of the defined types were placed in this encounter.   No follow-ups on file.    This visit occurred during the SARS-CoV-2 public health emergency.  Safety protocols were in place, including screening questions prior to the visit, additional usage of staff PPE, and extensive cleaning of exam room while observing appropriate contact time as indicated for disinfecting solutions.

## 2023-01-30 NOTE — Assessment & Plan Note (Signed)
Well adult Orders Placed This Encounter  Procedures   CMP14+EGFR   CBC with Differential/Platelet   Lipid Panel With LDL/HDL Ratio   Testosterone  Screening: per lab orders Immunizations: UTD Anticipatory guidance/Risk factor reduction:  Recommendations per AVS

## 2023-01-30 NOTE — Patient Instructions (Signed)
Preventive Care 18-21 Years Old, Male Preventive care refers to lifestyle choices and visits with your health care provider that can promote health and wellness. At this stage in your life, you may start seeing a primary care physician instead of a pediatrician for your preventive care. Preventive care visits are also called wellness exams. What can I expect for my preventive care visit? Counseling During your preventive care visit, your health care provider may ask about your: Medical history, including: Past medical problems. Family medical history. Current health, including: Home life and relationship well-being. Emotional well-being. Sexual activity and sexual health. Lifestyle, including: Alcohol, nicotine or tobacco, and drug use. Access to firearms. Diet, exercise, and sleep habits. Sunscreen use. Motor vehicle safety. Physical exam Your health care provider may check your: Height and weight. These may be used to calculate your BMI (body mass index). BMI is a measurement that tells if you are at a healthy weight. Waist circumference. This measures the distance around your waistline. This measurement also tells if you are at a healthy weight and may help predict your risk of certain diseases, such as type 2 diabetes and high blood pressure. Heart rate and blood pressure. Body temperature. Skin for abnormal spots. What immunizations do I need?  Vaccines are usually given at various ages, according to a schedule. Your health care provider will recommend vaccines for you based on your age, medical history, and lifestyle or other factors, such as travel or where you work. What tests do I need? Screening Your health care provider may recommend screening tests for certain conditions. This may include: Vision and hearing tests. Lipid and cholesterol levels. Hepatitis B test. Hepatitis C test. HIV (human immunodeficiency virus) test. STI (sexually transmitted infection) testing, if  you are at risk. Tuberculosis skin test. Talk with your health care provider about your test results, treatment options, and if necessary, the need for more tests. Follow these instructions at home: Eating and drinking  Eat a healthy diet that includes fresh fruits and vegetables, whole grains, lean protein, and low-fat dairy products. Drink enough fluid to keep your urine pale yellow. Do not drink alcohol if: Your health care provider tells you not to drink. You are under the legal drinking age. In the [place] the legal drinking age is 21. If you drink alcohol: Limit how much you have to 0-2 drinks a day. Know how much alcohol is in your drink. In the [place] one drink equals one 12 oz bottle of beer (355 mL), one 5 oz glass of wine (148 mL), or one 1 oz glass of hard liquor (44 mL). Lifestyle Brush your teeth every morning and night with fluoride toothpaste. Floss one time each day. Exercise for at least 30 minutes 5 or more days of the week. Do not use any products that contain nicotine or tobacco. These products include cigarettes, chewing tobacco, and vaping devices, such as e-cigarettes. If you need help quitting, ask your health care provider. Do not use drugs. If you are sexually active, practice safe sex. Use a condom or other form of protection to prevent STIs. Find healthy ways to manage stress, such as: Meditation, yoga, or listening to music. Journaling. Talking to a trusted person. Spending time with friends and family. Safety Always wear your seat belt while driving or riding in a vehicle. Do not drive: If you have been drinking alcohol. Do not ride with someone who has been drinking. When you are tired or distracted. While texting. If you have been using   any mind-altering substances or drugs. Wear a helmet and other protective equipment during sports activities. If you have firearms in your house, make sure you follow all gun safety procedures. Seek help if you have  been bullied, physically abused, or sexually abused. Use the internet responsibly to avoid dangers, such as online bullying and online sex predators. What's next? Go to your health care provider once a year for an annual wellness visit. Ask your health care provider how often you should have your eyes and teeth checked. Stay up to date on all vaccines. This information is not intended to replace advice given to you by your health care provider. Make sure you discuss any questions you have with your health care provider. Document Revised: 10/12/2020 Document Reviewed: 10/12/2020 Elsevier Patient Education  2024 [employer]  

## 2023-01-31 LAB — CMP14+EGFR
ALT: 29 [IU]/L (ref 0–44)
AST: 49 [IU]/L — ABNORMAL HIGH (ref 0–40)
Albumin: 4.9 g/dL (ref 4.3–5.2)
Alkaline Phosphatase: 71 [IU]/L (ref 44–121)
BUN/Creatinine Ratio: 20 (ref 9–20)
BUN: 19 mg/dL (ref 6–20)
Bilirubin Total: 0.6 mg/dL (ref 0.0–1.2)
CO2: 22 mmol/L (ref 20–29)
Calcium: 9.4 mg/dL (ref 8.7–10.2)
Chloride: 102 mmol/L (ref 96–106)
Creatinine, Ser: 0.97 mg/dL (ref 0.76–1.27)
Globulin, Total: 2.1 g/dL (ref 1.5–4.5)
Glucose: 87 mg/dL (ref 70–99)
Potassium: 4 mmol/L (ref 3.5–5.2)
Sodium: 139 mmol/L (ref 134–144)
Total Protein: 7 g/dL (ref 6.0–8.5)
eGFR: 114 mL/min/{1.73_m2} (ref >59)

## 2023-01-31 LAB — LIPID PANEL WITH LDL/HDL RATIO
Cholesterol, Total: 180 mg/dL (ref 100–199)
HDL: 67 mg/dL (ref >39)
LDL Chol Calc (NIH): 104 mg/dL — ABNORMAL HIGH (ref 0–99)
LDL/HDL Ratio: 1.6 {ratio} (ref 0.0–3.6)
Triglycerides: 47 mg/dL (ref 0–149)
VLDL Cholesterol Cal: 9 mg/dL (ref 5–40)

## 2023-01-31 LAB — CBC WITH DIFFERENTIAL/PLATELET
Basophils Absolute: 0 10*3/uL (ref 0.0–0.2)
Basos: 0 %
EOS (ABSOLUTE): 0 10*3/uL (ref 0.0–0.4)
Eos: 0 %
Hematocrit: 44.1 % (ref 37.5–51.0)
Hemoglobin: 14.8 g/dL (ref 13.0–17.7)
Immature Grans (Abs): 0 10*3/uL (ref 0.0–0.1)
Immature Granulocytes: 0 %
Lymphocytes Absolute: 1.8 10*3/uL (ref 0.7–3.1)
Lymphs: 23 %
MCH: 29.7 pg (ref 26.6–33.0)
MCHC: 33.6 g/dL (ref 31.5–35.7)
MCV: 88 fL (ref 79–97)
Monocytes Absolute: 0.5 10*3/uL (ref 0.1–0.9)
Monocytes: 6 %
Neutrophils Absolute: 5.5 10*3/uL (ref 1.4–7.0)
Neutrophils: 71 %
Platelets: 233 10*3/uL (ref 150–450)
RBC: 4.99 x10E6/uL (ref 4.14–5.80)
RDW: 12.6 % (ref 11.6–15.4)
WBC: 7.8 10*3/uL (ref 3.4–10.8)

## 2023-01-31 LAB — TESTOSTERONE: Testosterone: 667 ng/dL (ref 264–916)

## 2023-09-25 DIAGNOSIS — M9901 Segmental and somatic dysfunction of cervical region: Secondary | ICD-10-CM | POA: Diagnosis not present

## 2023-09-25 DIAGNOSIS — M9903 Segmental and somatic dysfunction of lumbar region: Secondary | ICD-10-CM | POA: Diagnosis not present

## 2023-09-25 DIAGNOSIS — M222X1 Patellofemoral disorders, right knee: Secondary | ICD-10-CM | POA: Diagnosis not present

## 2023-09-25 DIAGNOSIS — M9906 Segmental and somatic dysfunction of lower extremity: Secondary | ICD-10-CM | POA: Diagnosis not present

## 2023-10-02 DIAGNOSIS — M9903 Segmental and somatic dysfunction of lumbar region: Secondary | ICD-10-CM | POA: Diagnosis not present

## 2023-10-02 DIAGNOSIS — M222X1 Patellofemoral disorders, right knee: Secondary | ICD-10-CM | POA: Diagnosis not present

## 2023-10-02 DIAGNOSIS — M9906 Segmental and somatic dysfunction of lower extremity: Secondary | ICD-10-CM | POA: Diagnosis not present

## 2023-10-02 DIAGNOSIS — M9901 Segmental and somatic dysfunction of cervical region: Secondary | ICD-10-CM | POA: Diagnosis not present

## 2023-10-14 DIAGNOSIS — M9906 Segmental and somatic dysfunction of lower extremity: Secondary | ICD-10-CM | POA: Diagnosis not present

## 2023-10-14 DIAGNOSIS — M9901 Segmental and somatic dysfunction of cervical region: Secondary | ICD-10-CM | POA: Diagnosis not present

## 2023-10-14 DIAGNOSIS — M222X1 Patellofemoral disorders, right knee: Secondary | ICD-10-CM | POA: Diagnosis not present

## 2023-10-14 DIAGNOSIS — M9903 Segmental and somatic dysfunction of lumbar region: Secondary | ICD-10-CM | POA: Diagnosis not present

## 2023-10-29 DIAGNOSIS — M9903 Segmental and somatic dysfunction of lumbar region: Secondary | ICD-10-CM | POA: Diagnosis not present

## 2023-10-29 DIAGNOSIS — M9901 Segmental and somatic dysfunction of cervical region: Secondary | ICD-10-CM | POA: Diagnosis not present

## 2023-10-29 DIAGNOSIS — M222X1 Patellofemoral disorders, right knee: Secondary | ICD-10-CM | POA: Diagnosis not present

## 2023-10-29 DIAGNOSIS — M9906 Segmental and somatic dysfunction of lower extremity: Secondary | ICD-10-CM | POA: Diagnosis not present

## 2023-11-12 DIAGNOSIS — M222X1 Patellofemoral disorders, right knee: Secondary | ICD-10-CM | POA: Diagnosis not present

## 2023-11-12 DIAGNOSIS — M9901 Segmental and somatic dysfunction of cervical region: Secondary | ICD-10-CM | POA: Diagnosis not present

## 2023-11-12 DIAGNOSIS — M9906 Segmental and somatic dysfunction of lower extremity: Secondary | ICD-10-CM | POA: Diagnosis not present

## 2023-11-12 DIAGNOSIS — M9903 Segmental and somatic dysfunction of lumbar region: Secondary | ICD-10-CM | POA: Diagnosis not present

## 2023-11-14 DIAGNOSIS — Z0000 Encounter for general adult medical examination without abnormal findings: Secondary | ICD-10-CM | POA: Diagnosis not present

## 2023-11-14 DIAGNOSIS — Z23 Encounter for immunization: Secondary | ICD-10-CM | POA: Diagnosis not present

## 2023-11-25 DIAGNOSIS — M9906 Segmental and somatic dysfunction of lower extremity: Secondary | ICD-10-CM | POA: Diagnosis not present

## 2023-11-25 DIAGNOSIS — M222X1 Patellofemoral disorders, right knee: Secondary | ICD-10-CM | POA: Diagnosis not present

## 2023-11-25 DIAGNOSIS — M9903 Segmental and somatic dysfunction of lumbar region: Secondary | ICD-10-CM | POA: Diagnosis not present

## 2023-11-25 DIAGNOSIS — M9901 Segmental and somatic dysfunction of cervical region: Secondary | ICD-10-CM | POA: Diagnosis not present

## 2023-11-29 DIAGNOSIS — M25572 Pain in left ankle and joints of left foot: Secondary | ICD-10-CM | POA: Diagnosis not present

## 2023-11-29 DIAGNOSIS — S93401S Sprain of unspecified ligament of right ankle, sequela: Secondary | ICD-10-CM | POA: Diagnosis not present

## 2023-11-29 DIAGNOSIS — M25571 Pain in right ankle and joints of right foot: Secondary | ICD-10-CM | POA: Diagnosis not present

## 2023-11-29 DIAGNOSIS — S93402S Sprain of unspecified ligament of left ankle, sequela: Secondary | ICD-10-CM | POA: Diagnosis not present

## 2023-12-10 ENCOUNTER — Ambulatory Visit (INDEPENDENT_AMBULATORY_CARE_PROVIDER_SITE_OTHER): Payer: Self-pay | Admitting: Family Medicine

## 2023-12-10 ENCOUNTER — Encounter: Payer: Self-pay | Admitting: Family Medicine

## 2023-12-10 VITALS — BP 126/71 | HR 74 | Resp 20 | Ht 64.0 in | Wt 151.0 lb

## 2023-12-10 DIAGNOSIS — M9903 Segmental and somatic dysfunction of lumbar region: Secondary | ICD-10-CM | POA: Diagnosis not present

## 2023-12-10 DIAGNOSIS — Z130 Encounter for screening for diseases of the blood and blood-forming organs and certain disorders involving the immune mechanism: Secondary | ICD-10-CM | POA: Diagnosis not present

## 2023-12-10 DIAGNOSIS — Z23 Encounter for immunization: Secondary | ICD-10-CM | POA: Insufficient documentation

## 2023-12-10 DIAGNOSIS — M9906 Segmental and somatic dysfunction of lower extremity: Secondary | ICD-10-CM | POA: Diagnosis not present

## 2023-12-10 DIAGNOSIS — M222X1 Patellofemoral disorders, right knee: Secondary | ICD-10-CM | POA: Diagnosis not present

## 2023-12-10 DIAGNOSIS — M9901 Segmental and somatic dysfunction of cervical region: Secondary | ICD-10-CM | POA: Diagnosis not present

## 2023-12-10 NOTE — Progress Notes (Signed)
 Ray Fernandez - 23 y.o. male MRN [id number]  Date of birth: 01-Aug-2000  Subjective No chief complaint on file.   HPI Ray Fernandez is a 23 y.o. male here today for updated immunizations and screening.  He is starting college in the fall and will be wresting.  He needs sickle cell and ferritin testing.  He also needs 2nd meningitis B vaccine.  There is not family history of sickle cell disease and denies family history of sudden cardiac death.  He has normal exercise tolerance without chest pain.    ROS:  A comprehensive ROS was completed and negative except as noted per HPI  Allergies  Allergen Reactions   Ibuprofen Swelling    Angioedema of the Lips    History reviewed. No pertinent past medical history.  Past Surgical History:  Procedure Laterality Date   No prior surgery      Social History   Socioeconomic History   Marital status: Single    Spouse name: Not on file   Number of children: Not on file   Years of education: Not on file   Highest education level: Associate degree: occupational, Scientist, product/process development, or vocational program  Occupational History   Occupation: Company secretary  Tobacco Use   Smoking status: Never   Smokeless tobacco: Never  Vaping Use   Vaping status: Every Day   Start date: 09/29/2019   Substances: Nicotine, CBD  Substance and Sexual Activity   Alcohol use: Yes    Alcohol/week: 1.0 - 2.0 standard drink of alcohol    Types: 1 - 2 Standard drinks or equivalent per week    Comment: Occasional   Drug use: Yes    Types: Marijuana   Sexual activity: Not Currently  Other Topics Concern   Not on file  Social History Narrative   Not on file   Social Drivers of Health   Financial Resource Strain: Low Risk  (01/30/2023)   Overall Financial Resource Strain (CARDIA)    Difficulty of Paying Living Expenses: Not very hard  Food Insecurity: Low Risk  (11/28/2023)   Received from [facility]   Hunger Vital Sign    Within the past 12 months, you  worried that your food would run out before you got money to buy more: Never true    Within the past 12 months, the food you bought just didn't last and you didn't have money to get more. : Never true  Transportation Needs: No Transportation Needs (11/28/2023)   Received from [facility]    In the past 12 months, has lack of reliable transportation kept you from medical appointments, meetings, work or from getting things needed for daily living? : No  Physical Activity: Sufficiently Active (01/30/2023)   Exercise Vital Sign    Days of Exercise per Week: 5 days    Minutes of Exercise per Session: 90 min  Stress: Stress Concern Present (01/30/2023)   [employer] of Occupational Health - Occupational Stress Questionnaire    Feeling of Stress : Rather much  Social Connections: Unknown (01/30/2023)   Social Connection and Isolation Panel    Frequency of Communication with Friends and Family: Three times a week    Frequency of Social Gatherings with Friends and Family: Three times a week    Attends Religious Services: Never    Active Member of Clubs or Organizations: Yes    Attends Banker Meetings: More than 4 times per year    Marital Status: Patient declined  Family History  Problem Relation Age of Onset   Thyroid disease Mother    Hypertension Father    Eczema Sister    Eczema Brother    Eczema Brother    Epilepsy Maternal Aunt    Epilepsy Maternal Grandmother    Stroke Paternal Grandfather     Health Maintenance  Topic Date Due   Meningococcal B Vaccine (1 of 2 - Standard) 12/04/2019   COVID-19 Vaccine (1 - 2024-25 season) Never done   INFLUENZA VACCINE  11/29/2023   DTaP/Tdap/Td (7 - Td or Tdap) 01/29/2033   Pneumococcal Vaccine: 19-49 Years  Completed   Hepatitis B Vaccines  Completed   HPV VACCINES  Completed   Hepatitis C Screening  Completed   HIV Screening  Completed      ----------------------------------------------------------------------------------------------------------------------------------------------------------------------------------------------------------------- Physical Exam BP 126/71 (BP Location: Left Arm, Patient Position: Sitting, Cuff Size: Normal)   Pulse 74   Resp 20   Ht 5' 4 (1.626 m)   Wt 151 lb (68.5 kg)   SpO2 100%   BMI 25.92 kg/m   Physical Exam Constitutional:      Appearance: Normal appearance.  HENT:     Head: Normocephalic and atraumatic.  Cardiovascular:     Rate and Rhythm: Normal rate and regular rhythm.  Pulmonary:     Effort: Pulmonary effort is normal.     Breath sounds: Normal breath sounds.  Neurological:     General: No focal deficit present.     Mental Status: He is alert.  Psychiatric:        Mood and Affect: Mood normal.        Behavior: Behavior normal.     ------------------------------------------------------------------------------------------------------------------------------------------------------------------------------------------------------------------- Assessment and Plan  Immunization due Meningitis B #2 given today.   Screening for sickle-cell disease or trait Orders Placed This Encounter  Procedures   Meningococcal B, OMV (Bexsero)   Sickle Cell Scr   Ferritin     No orders of the defined types were placed in this encounter.   No follow-ups on file.

## 2023-12-10 NOTE — Assessment & Plan Note (Signed)
 Meningitis B #2 given today.

## 2023-12-10 NOTE — Assessment & Plan Note (Signed)
 Orders Placed This Encounter  Procedures   Meningococcal B, OMV (Bexsero)   Sickle Cell Scr   Ferritin

## 2023-12-11 LAB — FERRITIN: Ferritin: 136 ng/mL (ref 30–400)

## 2023-12-12 ENCOUNTER — Ambulatory Visit: Payer: Self-pay | Admitting: Family Medicine

## 2023-12-12 LAB — SICKLE CELL SCREEN: Sickle Cell Screen: NEGATIVE

## 2024-01-20 DIAGNOSIS — Z720 Tobacco use: Secondary | ICD-10-CM | POA: Diagnosis not present

## 2024-01-20 DIAGNOSIS — Z1331 Encounter for screening for depression: Secondary | ICD-10-CM | POA: Diagnosis not present

## 2024-01-20 DIAGNOSIS — E663 Overweight: Secondary | ICD-10-CM | POA: Diagnosis not present

## 2024-01-20 DIAGNOSIS — Z139 Encounter for screening, unspecified: Secondary | ICD-10-CM | POA: Diagnosis not present

## 2024-01-20 DIAGNOSIS — B349 Viral infection, unspecified: Secondary | ICD-10-CM | POA: Diagnosis not present

## 2024-01-20 DIAGNOSIS — Z6825 Body mass index (BMI) 25.0-25.9, adult: Secondary | ICD-10-CM | POA: Diagnosis not present

## 2024-02-24 DIAGNOSIS — Z1331 Encounter for screening for depression: Secondary | ICD-10-CM | POA: Diagnosis not present

## 2024-02-24 DIAGNOSIS — K529 Noninfective gastroenteritis and colitis, unspecified: Secondary | ICD-10-CM | POA: Diagnosis not present

## 2024-02-24 DIAGNOSIS — Z6824 Body mass index (BMI) 24.0-24.9, adult: Secondary | ICD-10-CM | POA: Diagnosis not present

## 2024-02-24 DIAGNOSIS — Z720 Tobacco use: Secondary | ICD-10-CM | POA: Diagnosis not present

## 2024-03-30 DIAGNOSIS — J029 Acute pharyngitis, unspecified: Secondary | ICD-10-CM | POA: Diagnosis not present

## 2024-03-30 DIAGNOSIS — Z720 Tobacco use: Secondary | ICD-10-CM | POA: Diagnosis not present

## 2024-03-30 DIAGNOSIS — Z1331 Encounter for screening for depression: Secondary | ICD-10-CM | POA: Diagnosis not present

## 2024-03-30 DIAGNOSIS — Z6824 Body mass index (BMI) 24.0-24.9, adult: Secondary | ICD-10-CM | POA: Diagnosis not present
